# Patient Record
Sex: Female | Born: 1950 | Race: White | Hispanic: No | Marital: Married | Smoking: Former smoker
Health system: Southern US, Academic
[De-identification: ages and names within clinical notes are randomized; demographics above are authoritative.]

## PROBLEM LIST (undated history)

## (undated) DIAGNOSIS — I10 Essential (primary) hypertension: Secondary | ICD-10-CM

## (undated) DIAGNOSIS — E119 Type 2 diabetes mellitus without complications: Secondary | ICD-10-CM

## (undated) HISTORY — DX: Type 2 diabetes mellitus without complications (CMS HCC): E11.9

## (undated) HISTORY — DX: Essential (primary) hypertension: I10

---

## 2021-12-14 IMAGING — MR MRI BRAIN W/ IAC W & W/O CONTRAST
8 of 13 series · 25 of 48 positions shown · IV contrast (prohance)
Comparison: none

﻿

Pertinent Hx:  Balance issues.    15ml prohance
TECHNIQUE: T1, T2, and FLAIR weighted axial images of the brain were performed along with T2 weighted coronal images. Additional diffusion weighted images were performed.  Contrast is administered and T1 weighted axials, coronals, and sagittals are performed.  Thin section T2 volumetric axials are performed through the posterior fossa with attention to the internal auditory canals. Thin section coronals and axials with T1 are performed through the posterior fossa without and with the administration of 15 mL Prohance intravenous contrast.

[Series 2: T1 · sagittal · 5.0mm · 0.47mm/px · 3 of 24 slices shown (1 of 3)]
[im 1/24]
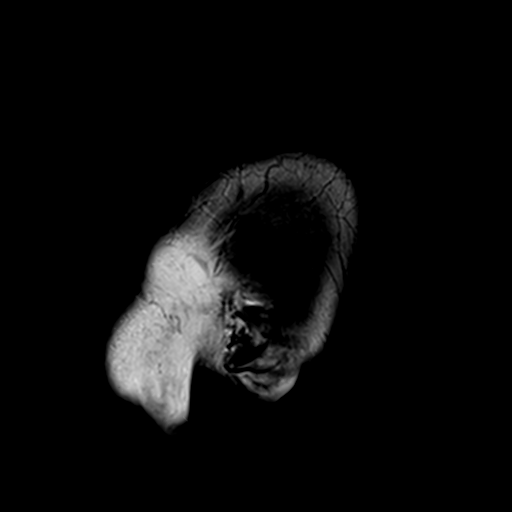
[im 12/24]
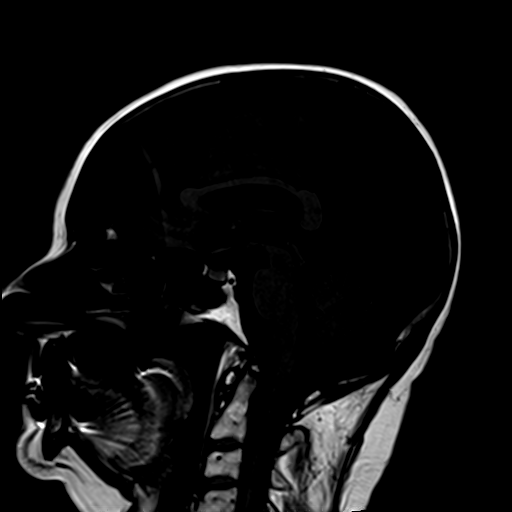
[im 24/24]
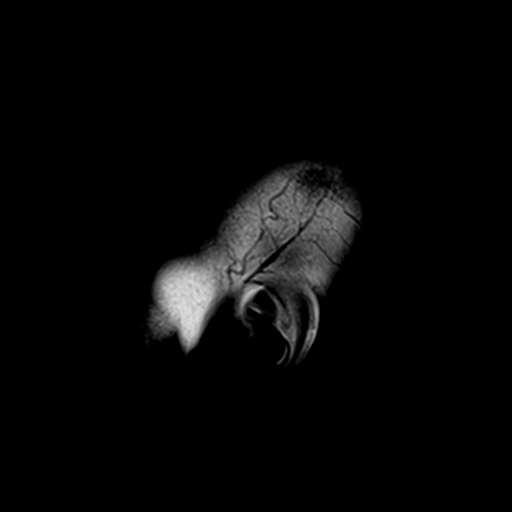

[Series 3: T2 · coronal · 5.0mm · 0.62mm/px · 4 of 30 slices shown (1 of 2)]
[im 1/30]
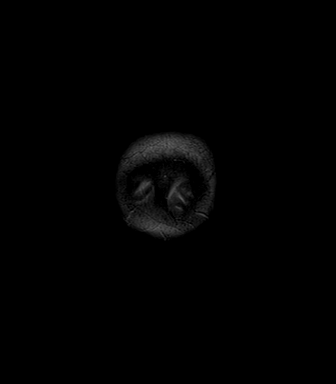
[im 10/30]
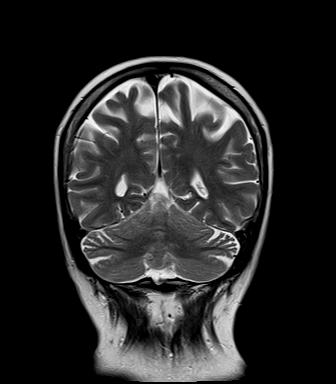
[im 20/30]
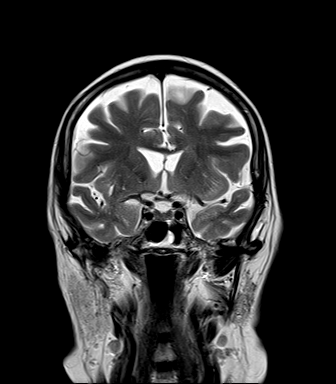
[im 30/30]
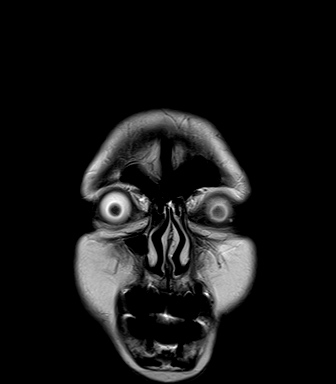

[Series 4: T2 · axial · 5.0mm · 0.62mm/px · z∈[-48,+88]mm · 4 of 26 slices shown (2 of 2)]
[im 1/26]
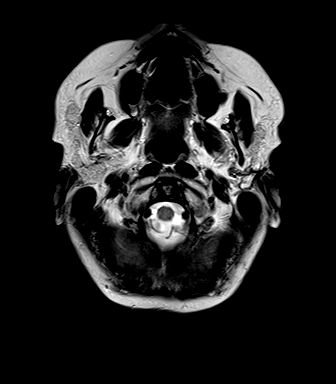
[im 9/26]
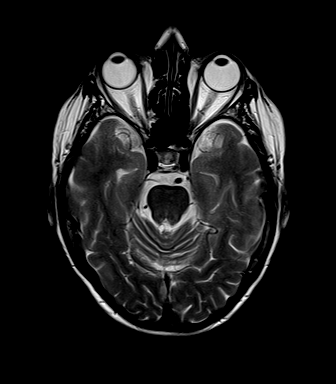
[im 17/26]
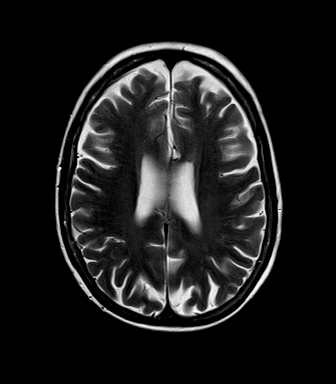
[im 26/26]
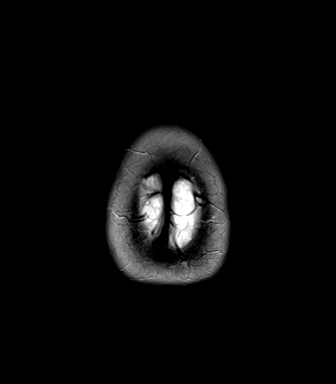

[Series 5: FLAIR · axial · 5.0mm · 0.47mm/px · z∈[-48,+88]mm · 4 of 26 slices shown]
[im 1/26]
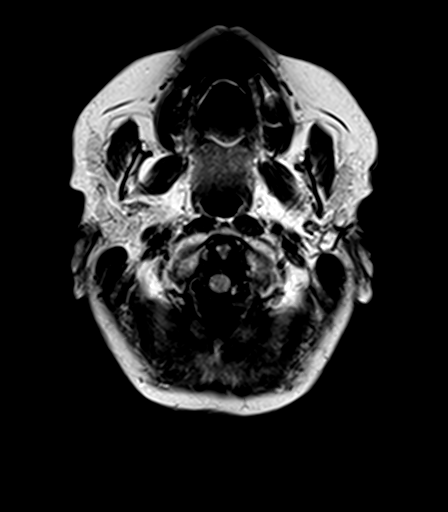
[im 9/26]
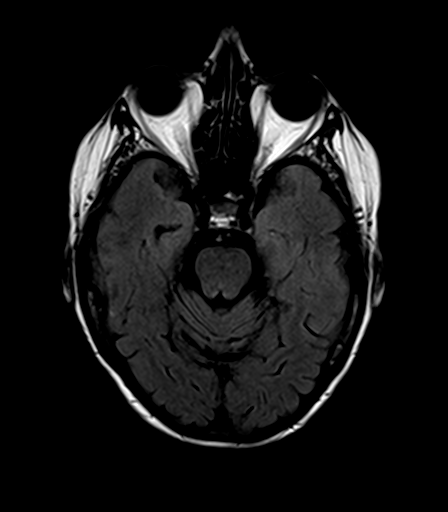
[im 17/26]
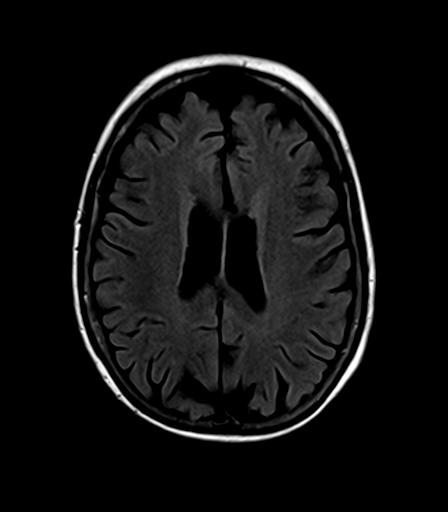
[im 26/26]
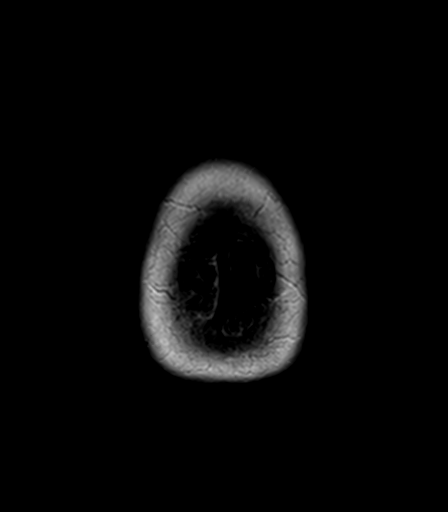

[Series 6: T1 · axial · 5.0mm · 0.47mm/px · z∈[-48,+88]mm · 4 of 26 slices shown (2 of 3)]
[im 1/26]
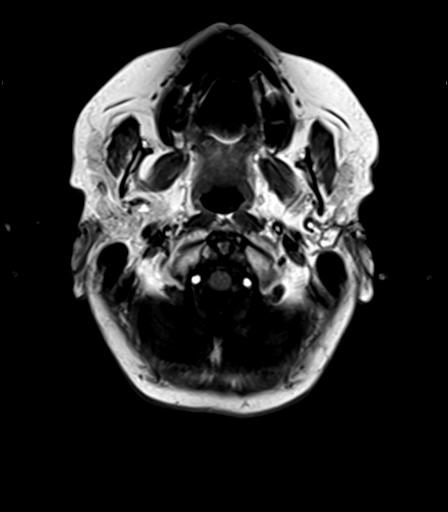
[im 9/26]
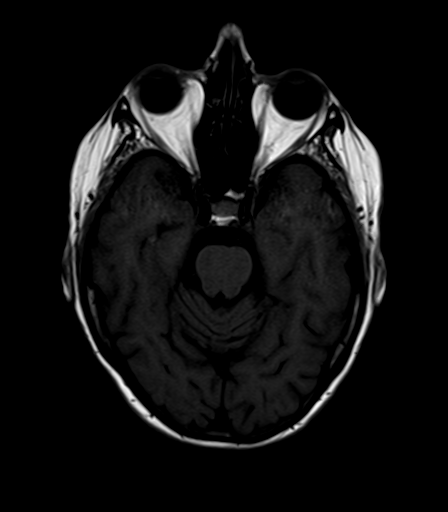
[im 17/26]
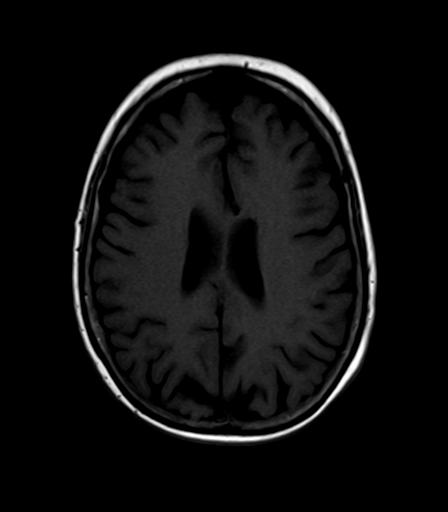
[im 26/26]
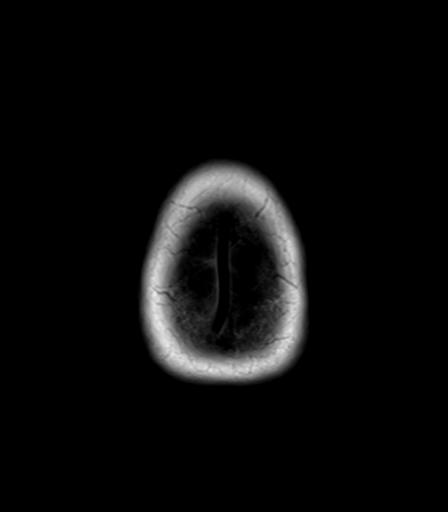

[Series 10: T1 · axial · 3.0mm · 0.35mm/px · z∈[-60,-14]mm · 2 of 15 slices shown (3 of 3)]
[im 1/15]
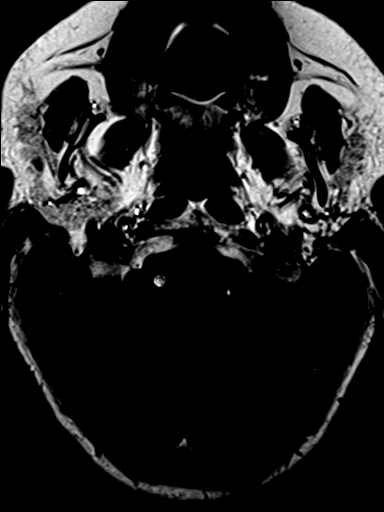
[im 15/15]
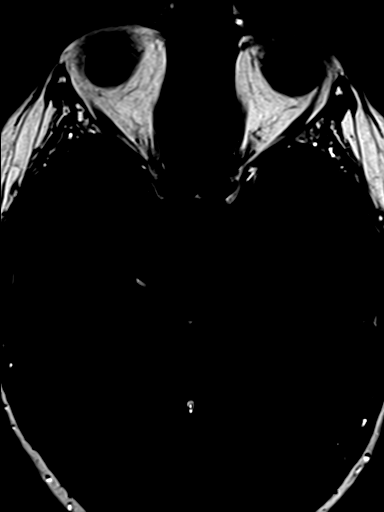

[Series 11: T1 post-contrast · axial · 3.0mm · 0.35mm/px · z∈[-60,-14]mm · 2 of 15 slices shown (1 of 2)]
[im 1/15]
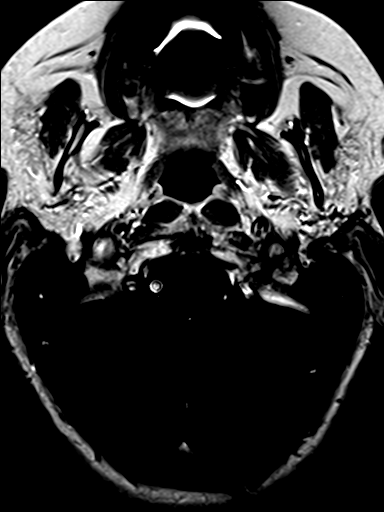
[im 15/15]
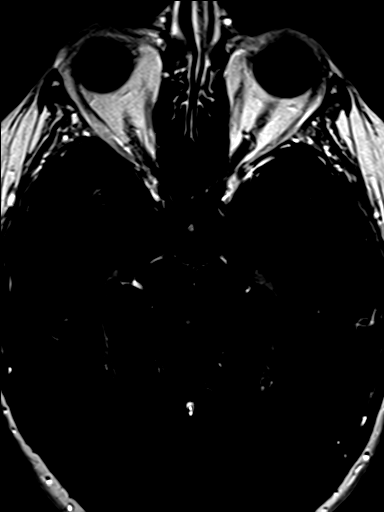

[Series 12: T1 post-contrast · coronal · 3.0mm · 0.37mm/px · 2 of 15 slices shown (2 of 2)]
[im 1/15]
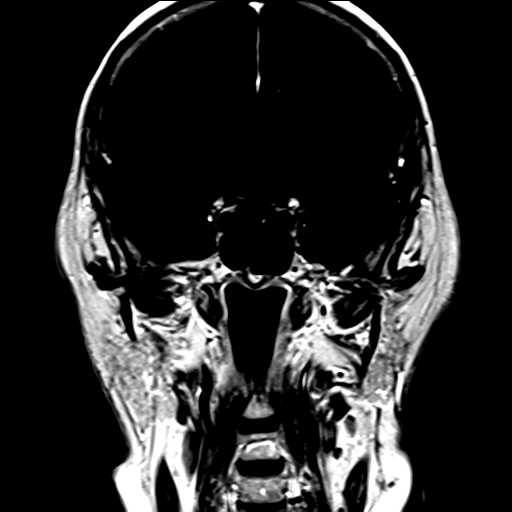
[im 15/15]
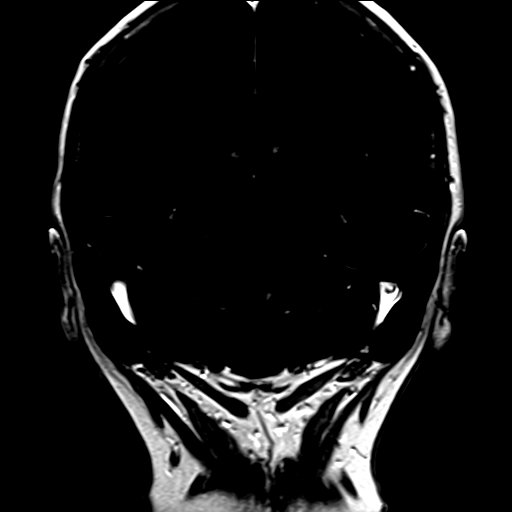

[25 of 48 positions shown; findings below may reference images not displayed]

FINDINGS: The fourth, third and lateral ventricles are normal in size and location.  There is a very minimal amount of frontoparietal atrophy.  There is no temporal lobe atrophy.  There is no ventriculomegaly.  There is some incidental fat in the posterior corpus callosum which is a normal variant.  Image 16 series 5.  No other pathologic abnormalities are noted.  Frontoparietal atrophy is minimal.  There is no temporal lobe atrophy or ventriculomegaly.  No abnormal enhancement is identified.
IMPRESSION: No abnormal enhancement is identified on this study.  There is very minimal frontoparietal atrophy on this study without ventriculomegaly.  No abnormal enhancement is seen.

## 2022-01-13 IMAGING — MR MRI CERVICAL SPINE WITHOUT CONTRAST
6 series · 40 of 48 positions shown · non-contrast
Comparison: prior scan dated May 21, 2020 is reviewed.

﻿

Pertinent Hx:  Neck pain, loss of balance.
TECHNIQUE: Sagittal images with T1, T2, and STIR weighting are performed through the cervical spine. Axial images with T1 and T2 weighting are performed through the C2-3, C3-4, C4-5, C5-6, C6-7, and C7-T1 disc spaces.

[Series 2: T2 · sagittal · 3.0mm · 0.39mm/px · 6 of 15 slices shown (1 of 3)]
[im 1/15]
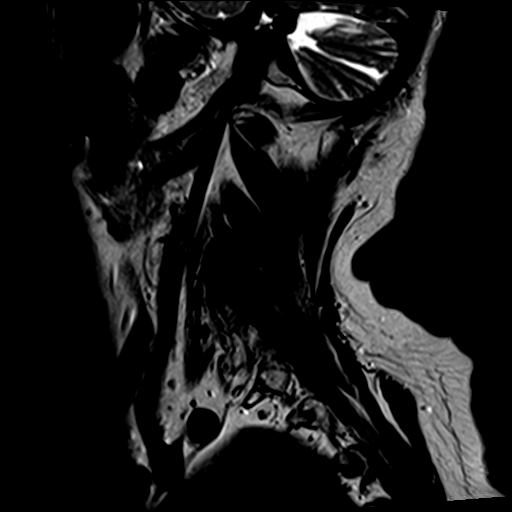
[im 3/15]
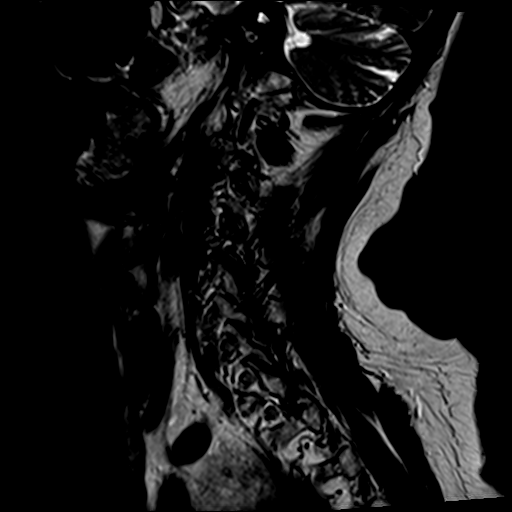
[im 6/15]
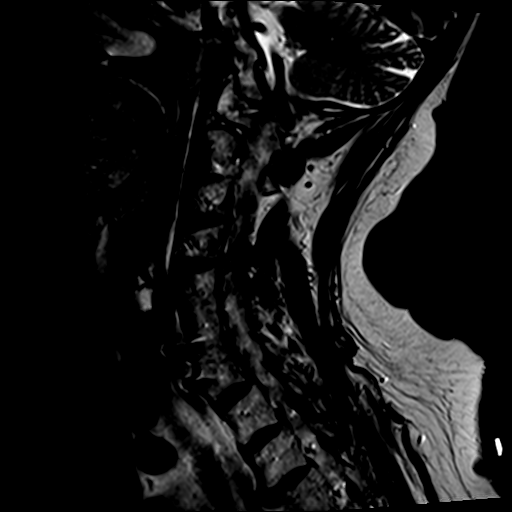
[im 9/15]
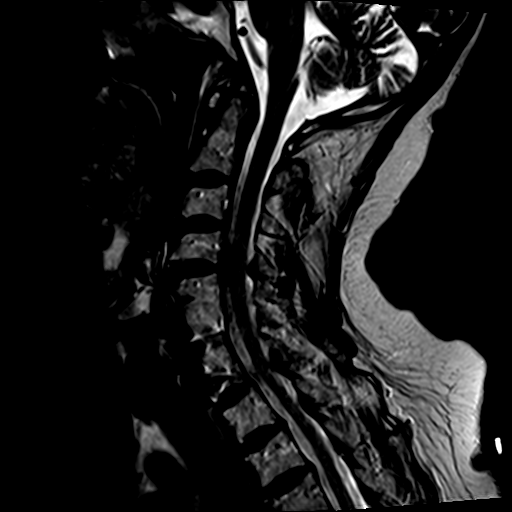
[im 12/15]
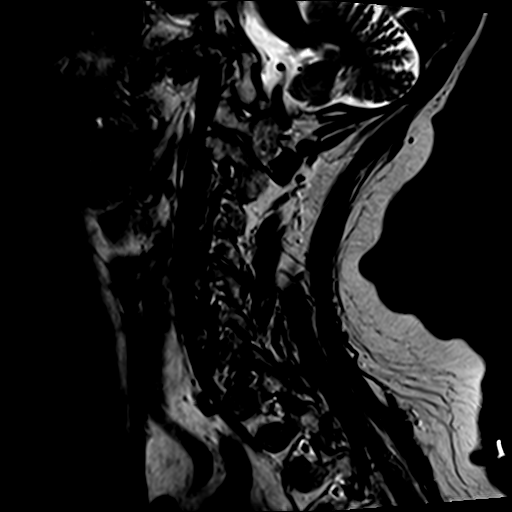
[im 15/15]
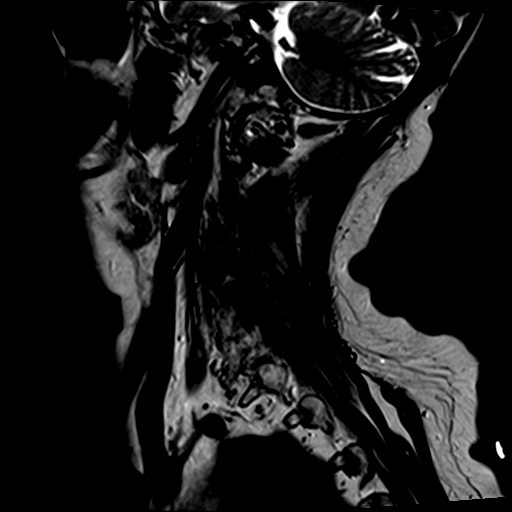

[Series 3: STIR · sagittal · 3.0mm · 0.39mm/px · 4 of 15 slices shown]
[im 1/15]
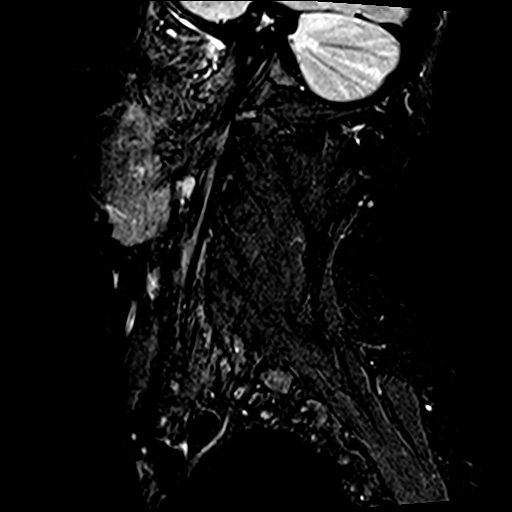
[im 3/15]
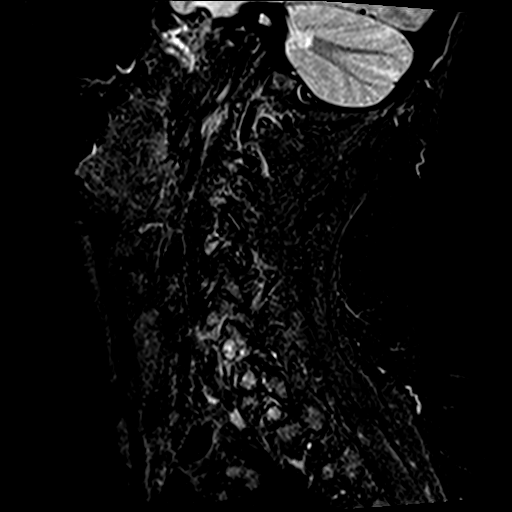
[im 6/15]
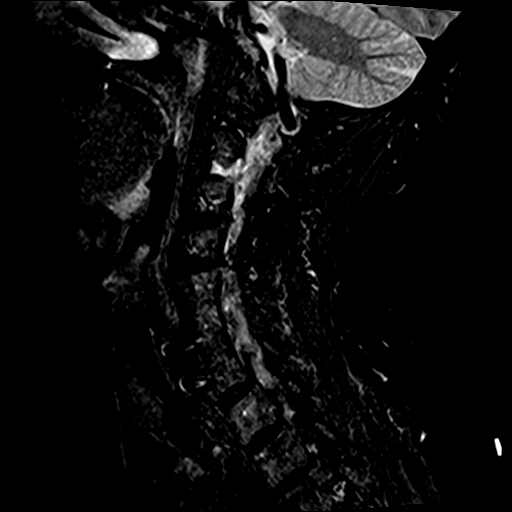
[im 9/15]
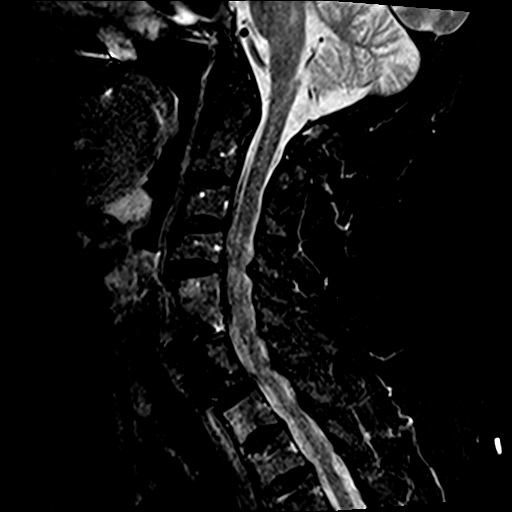

[Series 4: T2 · axial · 3.5mm · 0.62mm/px · z∈[-71,+21]mm · 8 of 23 slices shown (2 of 3)]
[im 1/23]
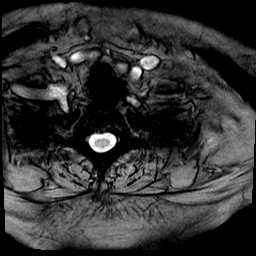
[im 3/23]
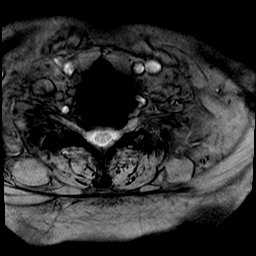
[im 8/23]
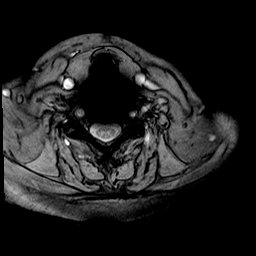
[im 10/23]
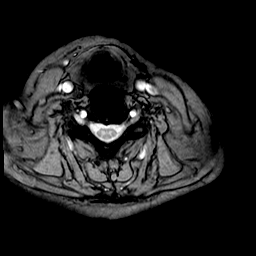
[im 13/23]
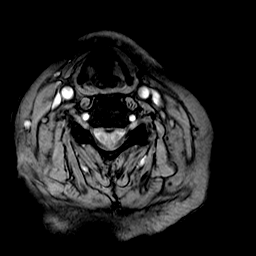
[im 15/23]
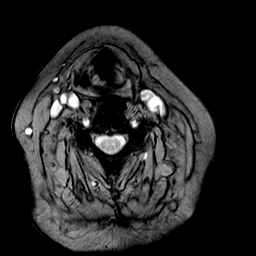
[im 20/23]
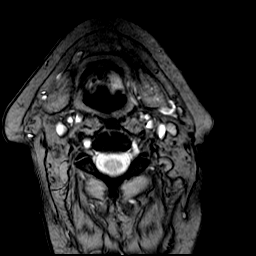
[im 23/23]
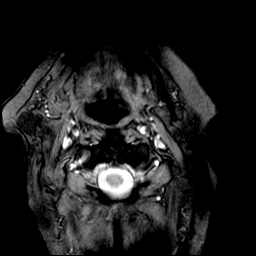

[Series 5: T2 · axial · 3.5mm · 0.62mm/px · z∈[-70,+22]mm · 8 of 23 slices shown (3 of 3)]
[im 1/23]
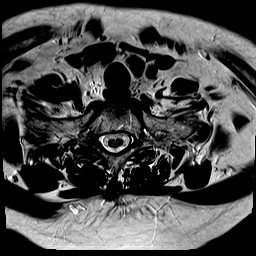
[im 3/23]
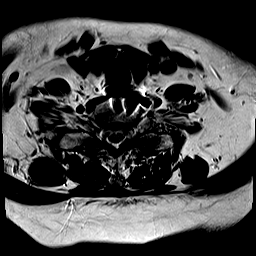
[im 8/23]
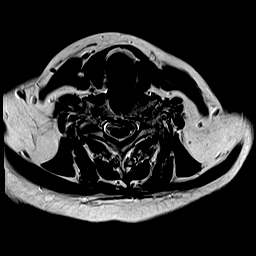
[im 10/23]
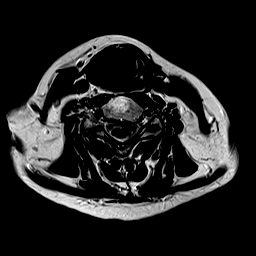
[im 13/23]
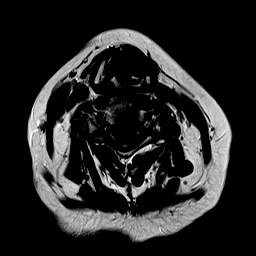
[im 15/23]
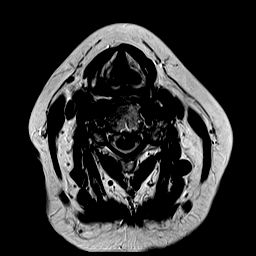
[im 20/23]
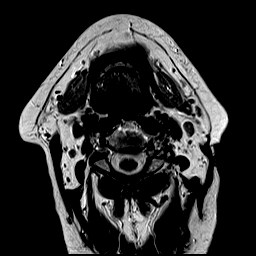
[im 23/23]
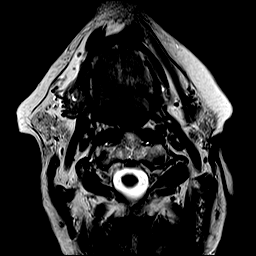

[Series 6: T1 · axial · 3.5mm · 0.62mm/px · z∈[-70,+22]mm · 8 of 23 slices shown (1 of 2)]
[im 1/23]
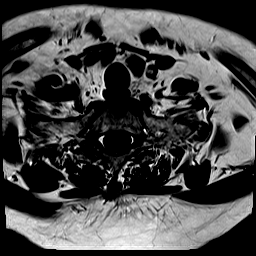
[im 3/23]
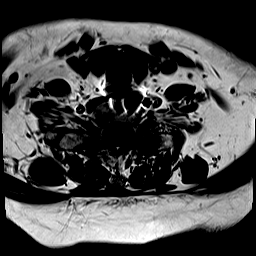
[im 8/23]
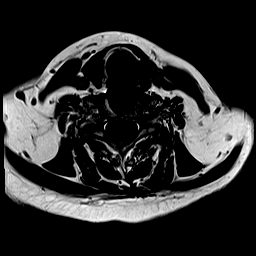
[im 10/23]
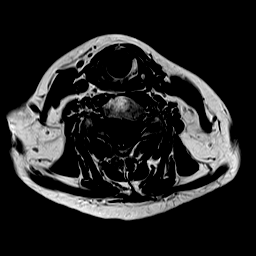
[im 13/23]
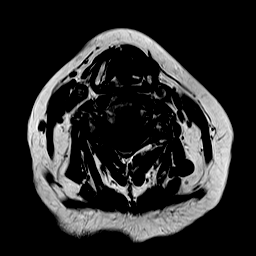
[im 15/23]
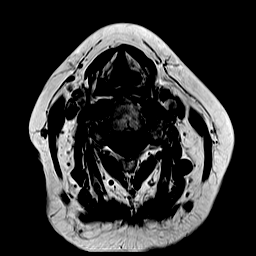
[im 20/23]
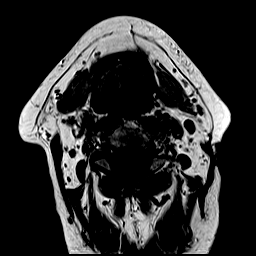
[im 23/23]
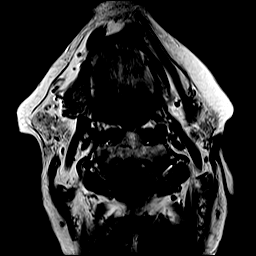

[Series 7: T1 · sagittal · 3.0mm · 0.78mm/px · 6 of 15 slices shown (2 of 2)]
[im 1/15]
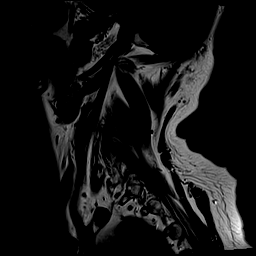
[im 3/15]
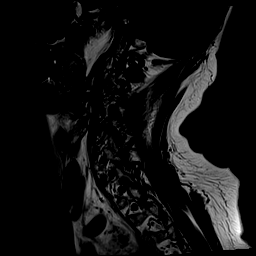
[im 6/15]
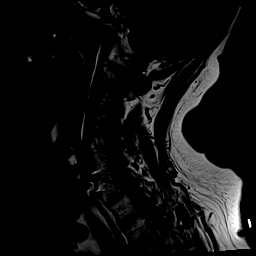
[im 9/15]
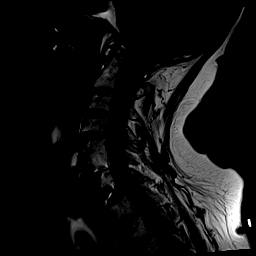
[im 12/15]
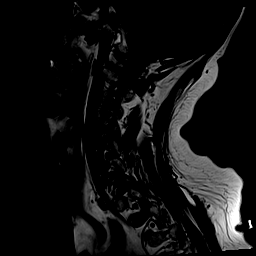
[im 15/15]
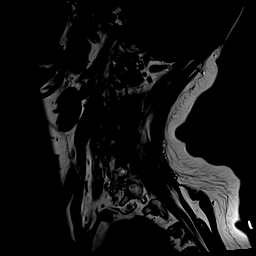

[40 of 48 positions shown; findings below may reference images not displayed]

FINDINGS: C5-C6 is completely fused which was found on the prior scan of April 2020.  

An anterior cervical discectomy with fusion and plating at C6-C7 is present unchanged when compared to the prior scan.  

A disc osteophyte complex arises posterior to the C4-C5 disc space and minimally effaces the subarachnoid space and there is no significant canal stenosis.  There is a very large degree of biforaminal stenosis at C4-C5.  There are no new changes at C4-C5 when compared to the prior scan.  

A minimal number of osteophytes arise from the posterior disc margin at C3-C4.  There is no significant neural canal stenosis at C3-C4.  There is a mild degree of biforaminal stenosis at C3-C4 bilaterally due to spurring arising from the uncovertebral joints.  These changes are unchanged when compared to the prior scan of 2020.  

C2-C3 remains normal.  

No canal or foraminal stenosis is present at C7-T1.
IMPRESSION: No interval change since the prior scan of May 21, 2020.  There has been no further surgery and there is no significant canal or foraminal stenosis at any level.  No abnormal signal is present within the cord.

## 2023-06-13 ENCOUNTER — Emergency Department (HOSPITAL_COMMUNITY): Payer: Medicare Other

## 2023-06-13 ENCOUNTER — Inpatient Hospital Stay (HOSPITAL_COMMUNITY): Payer: Medicare Other

## 2023-06-13 ENCOUNTER — Other Ambulatory Visit (HOSPITAL_COMMUNITY): Payer: Medicare Other

## 2023-06-13 ENCOUNTER — Other Ambulatory Visit: Payer: Self-pay

## 2023-06-13 ENCOUNTER — Inpatient Hospital Stay (HOSPITAL_COMMUNITY): Payer: Medicare Other | Admitting: Radiology

## 2023-06-13 ENCOUNTER — Encounter (HOSPITAL_COMMUNITY): Payer: Self-pay

## 2023-06-13 ENCOUNTER — Inpatient Hospital Stay
Admission: EM | Admit: 2023-06-13 | Discharge: 2023-06-16 | DRG: 551 | Disposition: A | Discharge status: Home / Self Care | Payer: Medicare Other | Source: Other Acute Inpatient Hospital | Attending: SURGICAL CRITICAL CARE | Admitting: SURGICAL CRITICAL CARE

## 2023-06-13 ENCOUNTER — Encounter (HOSPITAL_COMMUNITY): Payer: Self-pay | Admitting: SURGICAL CRITICAL CARE

## 2023-06-13 DIAGNOSIS — S52502A Unspecified fracture of the lower end of left radius, initial encounter for closed fracture: Secondary | ICD-10-CM | POA: Diagnosis present

## 2023-06-13 DIAGNOSIS — S066X0A Traumatic subarachnoid hemorrhage without loss of consciousness, initial encounter: Secondary | ICD-10-CM | POA: Diagnosis present

## 2023-06-13 DIAGNOSIS — S12291A Other nondisplaced fracture of third cervical vertebra, initial encounter for closed fracture: Secondary | ICD-10-CM

## 2023-06-13 DIAGNOSIS — E785 Hyperlipidemia, unspecified: Secondary | ICD-10-CM | POA: Diagnosis present

## 2023-06-13 DIAGNOSIS — S12290A Other displaced fracture of third cervical vertebra, initial encounter for closed fracture: Secondary | ICD-10-CM

## 2023-06-13 DIAGNOSIS — S12201A Unspecified nondisplaced fracture of third cervical vertebra, initial encounter for closed fracture: Principal | ICD-10-CM | POA: Diagnosis present

## 2023-06-13 DIAGNOSIS — R000 Tachycardia, unspecified: Secondary | ICD-10-CM

## 2023-06-13 DIAGNOSIS — F32A Depression, unspecified: Secondary | ICD-10-CM | POA: Diagnosis present

## 2023-06-13 DIAGNOSIS — S3981XA Other specified injuries of abdomen, initial encounter: Secondary | ICD-10-CM

## 2023-06-13 DIAGNOSIS — M2578 Osteophyte, vertebrae: Secondary | ICD-10-CM | POA: Diagnosis present

## 2023-06-13 DIAGNOSIS — Z880 Allergy status to penicillin: Secondary | ICD-10-CM

## 2023-06-13 DIAGNOSIS — W19XXXA Unspecified fall, initial encounter: Secondary | ICD-10-CM

## 2023-06-13 DIAGNOSIS — S12200A Unspecified displaced fracture of third cervical vertebra, initial encounter for closed fracture: Secondary | ICD-10-CM | POA: Insufficient documentation

## 2023-06-13 DIAGNOSIS — I479 Paroxysmal tachycardia, unspecified: Secondary | ICD-10-CM

## 2023-06-13 DIAGNOSIS — S066XAA Traumatic subarachnoid hemorrhage with loss of consciousness status unknown, initial encounter (CMS HCC): Secondary | ICD-10-CM

## 2023-06-13 DIAGNOSIS — F419 Anxiety disorder, unspecified: Secondary | ICD-10-CM | POA: Diagnosis present

## 2023-06-13 DIAGNOSIS — W010XXA Fall on same level from slipping, tripping and stumbling without subsequent striking against object, initial encounter: Secondary | ICD-10-CM | POA: Diagnosis present

## 2023-06-13 DIAGNOSIS — I340 Nonrheumatic mitral (valve) insufficiency: Secondary | ICD-10-CM

## 2023-06-13 DIAGNOSIS — I609 Nontraumatic subarachnoid hemorrhage, unspecified: Secondary | ICD-10-CM

## 2023-06-13 DIAGNOSIS — E119 Type 2 diabetes mellitus without complications: Secondary | ICD-10-CM | POA: Diagnosis present

## 2023-06-13 DIAGNOSIS — F17210 Nicotine dependence, cigarettes, uncomplicated: Secondary | ICD-10-CM | POA: Diagnosis present

## 2023-06-13 DIAGNOSIS — I499 Cardiac arrhythmia, unspecified: Secondary | ICD-10-CM

## 2023-06-13 DIAGNOSIS — Z79899 Other long term (current) drug therapy: Secondary | ICD-10-CM

## 2023-06-13 DIAGNOSIS — I10 Essential (primary) hypertension: Secondary | ICD-10-CM | POA: Diagnosis present

## 2023-06-13 DIAGNOSIS — I4719 Other supraventricular tachycardia: Secondary | ICD-10-CM | POA: Diagnosis present

## 2023-06-13 DIAGNOSIS — S3993XA Unspecified injury of pelvis, initial encounter: Secondary | ICD-10-CM

## 2023-06-13 DIAGNOSIS — Z7985 Long-term (current) use of injectable non-insulin antidiabetic drugs: Secondary | ICD-10-CM

## 2023-06-13 DIAGNOSIS — W1809XA Striking against other object with subsequent fall, initial encounter: Secondary | ICD-10-CM

## 2023-06-13 DIAGNOSIS — S52592A Other fractures of lower end of left radius, initial encounter for closed fracture: Secondary | ICD-10-CM

## 2023-06-13 DIAGNOSIS — D72829 Elevated white blood cell count, unspecified: Secondary | ICD-10-CM

## 2023-06-13 DIAGNOSIS — J9811 Atelectasis: Secondary | ICD-10-CM | POA: Diagnosis present

## 2023-06-13 DIAGNOSIS — W1830XA Fall on same level, unspecified, initial encounter: Secondary | ICD-10-CM

## 2023-06-13 DIAGNOSIS — J329 Chronic sinusitis, unspecified: Secondary | ICD-10-CM | POA: Diagnosis present

## 2023-06-13 DIAGNOSIS — S298XXA Other specified injuries of thorax, initial encounter: Secondary | ICD-10-CM

## 2023-06-13 DIAGNOSIS — Z881 Allergy status to other antibiotic agents status: Secondary | ICD-10-CM

## 2023-06-13 DIAGNOSIS — Z794 Long term (current) use of insulin: Secondary | ICD-10-CM

## 2023-06-13 DIAGNOSIS — S5290XA Unspecified fracture of unspecified forearm, initial encounter for closed fracture: Secondary | ICD-10-CM

## 2023-06-13 LAB — TROPONIN-I
TROPONIN-I HS: 3.4 ng/L (ref <=14.0)
TROPONIN-I HS: 2.9 ng/L (ref <=14.0)

## 2023-06-13 LAB — CBC WITH DIFF
BASOPHIL #: <0.1 10*3/uL (ref <=0.20)
BASOPHIL %: 0.6 %
EOSINOPHIL #: 0.38 10*3/uL (ref <=0.50)
EOSINOPHIL %: 3 %
HCT: 32.7 % — ABNORMAL LOW (ref 34.8–46.0)
HGB: 10.6 g/dL — ABNORMAL LOW (ref 11.5–16.0)
IMMATURE GRANULOCYTE #: <0.1 10*3/uL (ref <0.10)
IMMATURE GRANULOCYTE %: 0.3 % (ref 0.0–1.0)
LYMPHOCYTE #: 1.89 10*3/uL (ref 1.00–4.80)
LYMPHOCYTE %: 14.9 %
MCH: 29.1 pg (ref 26.0–32.0)
MCHC: 32.4 g/dL (ref 31.0–35.5)
MCV: 89.8 fL (ref 78.0–100.0)
MONOCYTE #: 1.44 10*3/uL — ABNORMAL HIGH (ref 0.20–1.10)
MONOCYTE %: 11.3 %
MPV: 9.3 fL (ref 8.7–12.5)
NEUTROPHIL #: 8.89 10*3/uL — ABNORMAL HIGH (ref 1.50–7.70)
NEUTROPHIL %: 69.9 %
PLATELETS: 222 10*3/uL (ref 150–400)
RBC: 3.64 10*6/uL — ABNORMAL LOW (ref 3.85–5.22)
RDW-CV: 13.7 % (ref 11.5–15.5)
WBC: 12.7 10*3/uL — ABNORMAL HIGH (ref 3.7–11.0)

## 2023-06-13 LAB — DRUG SCREEN, NO CONFIRMATION, URINE
AMPHETAMINES, URINE: NEGATIVE
BARBITURATES URINE: NEGATIVE
BENZODIAZEPINES URINE: NEGATIVE
BUPRENORPHINE URINE: NEGATIVE
CANNABINOIDS URINE: NEGATIVE
COCAINE METABOLITES URINE: NEGATIVE
CREATININE RANDOM URINE: 31 mg/dL (ref >=20)
ECSTASY/MDMA URINE: NEGATIVE
FENTANYL, RANDOM URINE: NEGATIVE
METHADONE URINE: NEGATIVE
OPIATES URINE (LOW CUTOFF): NEGATIVE
OXYCODONE URINE: POSITIVE — AB

## 2023-06-13 LAB — BLOOD GAS W/ CO-OX, LYTES, LACTATE REFLEX
%FIO2 (VENOUS): 21 %
BASE EXCESS: 1.5 mmol/L (ref 0.0–3.0)
BICARBONATE (VENOUS): 25.3 mmol/L (ref 22.0–29.0)
CARBOXYHEMOGLOBIN: 2 % (ref <=3.0)
CHLORIDE: 100 mmol/L (ref 98–107)
GLUCOSE: 136 mg/dL — ABNORMAL HIGH (ref 65–125)
HEMATOCRITRT: 33 % — ABNORMAL LOW (ref 37–50)
HEMOGLOBIN: 11 g/dL — ABNORMAL LOW (ref 12.0–18.0)
IONIZED CALCIUM: 1.21 mmol/L (ref 1.15–1.33)
LACTATE: 2.2 mmol/L — ABNORMAL HIGH (ref <=1.9)
MET-HEMOGLOBIN: <0.7 % (ref <=1.5)
O2 SATURATION (VENOUS): 74.9 %
O2CT: 9.3 %
OXYHEMOGLOBIN: 59.8 %
PCO2 (VENOUS): 43 mm/Hg (ref 41–51)
PH (VENOUS): 7.4 (ref 7.32–7.43)
PO2 (VENOUS): 40 mm/Hg
SODIUM: 133 mmol/L — ABNORMAL LOW (ref 136–145)
WHOLE BLOOD POTASSIUM: 3.8 mmol/L (ref 3.5–5.1)

## 2023-06-13 LAB — TRANSTHORACIC ECHOCARDIOGRAM - ADULT
Aortic Valve Area by Continuity of Peak Velocity: 2.21 cm2
Aortic Valve Area by Continuity of VTI: 2.41 cm2
FS: 35 percent (ref 28–44)
IVS: 1 cm (ref 0.6–1.1)
LV Diastolic Volume Index: 54.8 milliliter
LV Diastolic Volume Index: 69.3 milliliter
LV Diastolic Volume Index: 77.7 milliliter
LV Diastolic Volume Index: 84.3 milliliter
LV Systolic Volume Index: 11.9 milliliter
LV Systolic Volume Index: 14.6 milliliter
LV Systolic Volume Index: 27.4 milliliter
LV Systolic Volume Index: 9.3 milliliter
LVIDD: 4.18 cm (ref 3.5–6.0)
LVIDS: 2.72 cm (ref 2.1–4.0)
LVOT diameter: 2.08 cm
LVOT stroke volume: 78.7 milliliter
LVPWD: 1.07 cm
MV Deceleration Time: 213.05 ms
MV Peak A Vel: 92.52 cm/s
MV Peak E Vel: 78.57 cm/s
Pulmonic Valve Acceleration Time: 89.44 ms
TR VELOCITY: 242.97 cm/s
TR VELOCITY: 242.97 cm/s

## 2023-06-13 LAB — URINALYSIS, MICROSCOPIC
RBCS: 0 /hpf (ref <6.0)
WBCS: <1 /hpf (ref <11.0)

## 2023-06-13 LAB — LACTIC ACID - FIRST REFLEX: LACTIC ACID: 1.5 mmol/L (ref 0.5–2.2)

## 2023-06-13 LAB — ETHANOL, SERUM/PLASMA
ETHANOL: <10 mg/dL (ref <10)
ETHANOL: NOT DETECTED

## 2023-06-13 LAB — URINALYSIS, MACROSCOPIC
BILIRUBIN: NEGATIVE mg/dL
BLOOD: NEGATIVE mg/dL
COLOR: NORMAL
GLUCOSE: >=1000 mg/dL — AB
KETONES: 10 mg/dL — AB
LEUKOCYTES: NEGATIVE WBCs/uL
NITRITE: NEGATIVE
PH: 6.5 (ref 5.0–8.0)
PROTEIN: NEGATIVE mg/dL
SPECIFIC GRAVITY: 1.035 — ABNORMAL HIGH (ref 1.005–1.030)
UROBILINOGEN: NEGATIVE mg/dL

## 2023-06-13 LAB — BASIC METABOLIC PANEL
ANION GAP: 12 mmol/L (ref 4–13)
BUN/CREA RATIO: 20 (ref 6–22)
BUN: 16 mg/dL (ref 8–25)
CALCIUM: 9.8 mg/dL (ref 8.6–10.3)
CHLORIDE: 99 mmol/L (ref 96–111)
CO2 TOTAL: 24 mmol/L (ref 23–31)
CREATININE: 0.79 mg/dL (ref 0.60–1.05)
ESTIMATED GFR - FEMALE: 80 mL/min/BSA (ref >=60)
GLUCOSE: 131 mg/dL — ABNORMAL HIGH (ref 65–125)
POTASSIUM: 3.7 mmol/L (ref 3.5–5.1)
SODIUM: 135 mmol/L — ABNORMAL LOW (ref 136–145)

## 2023-06-13 LAB — PT/INR
INR: 1.03 (ref 0.80–1.20)
PROTHROMBIN TIME: 12.5 seconds (ref 9.8–14.4)

## 2023-06-13 LAB — TYPE AND SCREEN
ABO/RH(D): A POS
ANTIBODY SCREEN: NEGATIVE

## 2023-06-13 LAB — PTT (PARTIAL THROMBOPLASTIN TIME): APTT: 22.5 seconds — ABNORMAL LOW (ref 24.2–37.5)

## 2023-06-13 LAB — POC BLOOD GLUCOSE (RESULTS)
GLUCOSE, POC: 111 mg/dl — ABNORMAL HIGH (ref 70–105)
GLUCOSE, POC: 83 mg/dl (ref 70–105)

## 2023-06-13 MED ORDER — AMLODIPINE 5 MG TABLET
2.5000 mg | ORAL_TABLET | Freq: Every day | ORAL | Status: DC
Start: 2023-06-14 — End: 2023-06-16
  Administered 2023-06-14: 2.5 mg via ORAL
  Filled 2023-06-13: qty 1

## 2023-06-13 MED ORDER — TRIAMTERENE 75 MG-HYDROCHLOROTHIAZIDE 50 MG TABLET
1.0000 | ORAL_TABLET | Freq: Every day | ORAL | Status: DC
Start: 2023-06-14 — End: 2023-06-16
  Administered 2023-06-14 – 2023-06-16 (×3): 1 via ORAL
  Filled 2023-06-13 (×3): qty 1

## 2023-06-13 MED ORDER — AMITRIPTYLINE 10 MG TABLET
10.0000 mg | ORAL_TABLET | Freq: Every evening | ORAL | Status: DC
Start: 2023-06-13 — End: 2023-06-16
  Administered 2023-06-13 – 2023-06-15 (×3): 10 mg via ORAL
  Filled 2023-06-13 (×4): qty 1

## 2023-06-13 MED ORDER — SENNOSIDES 8.6 MG-DOCUSATE SODIUM 50 MG TABLET
1.0000 | ORAL_TABLET | Freq: Two times a day (BID) | ORAL | Status: DC
Start: 2023-06-13 — End: 2023-06-16
  Administered 2023-06-13: 0 via ORAL
  Administered 2023-06-13 – 2023-06-16 (×6): 1 via ORAL
  Filled 2023-06-13 (×6): qty 1

## 2023-06-13 MED ORDER — SODIUM CHLORIDE 0.9 % (FLUSH) INJECTION SYRINGE
2.0000 mL | INJECTION | INTRAMUSCULAR | Status: DC | PRN
Start: 2023-06-13 — End: 2023-06-16

## 2023-06-13 MED ORDER — ELECTROLYTE-A INTRAVENOUS SOLUTION
INTRAVENOUS | Status: DC
Start: 2023-06-13 — End: 2023-06-13
  Administered 2023-06-13: 0 mL via INTRAVENOUS

## 2023-06-13 MED ORDER — PANTOPRAZOLE 40 MG TABLET,DELAYED RELEASE
40.0000 mg | DELAYED_RELEASE_TABLET | Freq: Every evening | ORAL | Status: DC
Start: 2023-06-13 — End: 2023-06-16
  Administered 2023-06-13 – 2023-06-15 (×3): 40 mg via ORAL
  Filled 2023-06-13 (×3): qty 1

## 2023-06-13 MED ORDER — PAROXETINE 20 MG TABLET
30.0000 mg | ORAL_TABLET | Freq: Every morning | ORAL | Status: DC
Start: 2023-06-13 — End: 2023-06-13
  Administered 2023-06-13: 0 mg via ORAL
  Filled 2023-06-13 (×2): qty 1

## 2023-06-13 MED ORDER — SODIUM CHLORIDE 0.9 % (FLUSH) INJECTION SYRINGE
2.0000 mL | INJECTION | Freq: Three times a day (TID) | INTRAMUSCULAR | Status: DC
Start: 2023-06-13 — End: 2023-06-16
  Administered 2023-06-13 – 2023-06-14 (×4): 2 mL
  Administered 2023-06-14: 0 mL
  Administered 2023-06-15: 2 mL
  Administered 2023-06-15: 5 mL
  Administered 2023-06-15 – 2023-06-16 (×3): 0 mL

## 2023-06-13 MED ORDER — HYDRALAZINE 20 MG/ML INJECTION SOLUTION
10.0000 mg | INTRAMUSCULAR | Status: DC | PRN
Start: 2023-06-13 — End: 2023-06-15

## 2023-06-13 MED ORDER — GABAPENTIN 100 MG CAPSULE
100.0000 mg | ORAL_CAPSULE | Freq: Two times a day (BID) | ORAL | Status: DC
Start: 2023-06-13 — End: 2023-06-14
  Administered 2023-06-13 – 2023-06-14 (×2): 100 mg via ORAL
  Filled 2023-06-13 (×2): qty 1

## 2023-06-13 MED ORDER — ALBUTEROL SULFATE 2.5 MG/3 ML (0.083 %) SOLUTION FOR NEBULIZATION
2.5000 mg | INHALATION_SOLUTION | RESPIRATORY_TRACT | Status: DC | PRN
Start: 2023-06-13 — End: 2023-06-16
  Administered 2023-06-13: 2.5 mg via RESPIRATORY_TRACT
  Filled 2023-06-13: qty 3

## 2023-06-13 MED ORDER — EMPAGLIFLOZIN 12.5 MG-METFORMIN 1,000 MG TABLET
1.0000 | ORAL_TABLET | Freq: Every day | ORAL | Status: DC
Start: 2023-06-13 — End: 2023-06-13

## 2023-06-13 MED ORDER — OXYCODONE 10 MG TABLET
10.0000 mg | ORAL_TABLET | ORAL | Status: DC | PRN
Start: 2023-06-13 — End: 2023-06-16
  Administered 2023-06-13 – 2023-06-15 (×4): 10 mg via ORAL
  Filled 2023-06-13 (×4): qty 1

## 2023-06-13 MED ORDER — SODIUM CHLORIDE 0.9 % INJECTION SOLUTION
2.0000 mL | INTRAVENOUS | Status: AC
Start: 2023-06-13 — End: 2023-06-13
  Administered 2023-06-13: 2 mL via INTRAVENOUS

## 2023-06-13 MED ORDER — PAROXETINE 20 MG TABLET
30.0000 mg | ORAL_TABLET | Freq: Every evening | ORAL | Status: DC
Start: 2023-06-13 — End: 2023-06-16
  Administered 2023-06-13 – 2023-06-15 (×3): 30 mg via ORAL
  Filled 2023-06-13 (×4): qty 1

## 2023-06-13 MED ORDER — DEXTROSE 5% IN WATER (D5W) FLUSH BAG - 250 ML
INTRAVENOUS | Status: DC | PRN
Start: 2023-06-13 — End: 2023-06-16

## 2023-06-13 MED ORDER — OXYCODONE 5 MG TABLET
5.0000 mg | ORAL_TABLET | ORAL | Status: DC | PRN
Start: 2023-06-13 — End: 2023-06-16
  Administered 2023-06-13 – 2023-06-16 (×5): 5 mg via ORAL
  Filled 2023-06-13 (×5): qty 1

## 2023-06-13 MED ORDER — LOSARTAN 50 MG TABLET
100.0000 mg | ORAL_TABLET | Freq: Every day | ORAL | Status: DC
Start: 2023-06-13 — End: 2023-06-14

## 2023-06-13 MED ORDER — ELECTROLYTE-A INTRAVENOUS SOLUTION
INTRAVENOUS | Status: DC
Start: 2023-06-13 — End: 2023-06-14
  Administered 2023-06-14: 0 mL via INTRAVENOUS

## 2023-06-13 MED ORDER — CHOLECALCIFEROL (VITAMIN D3) 25 MCG (1,000 UNIT) TABLET
1000.0000 [IU] | ORAL_TABLET | Freq: Every day | ORAL | Status: DC
Start: 2023-06-14 — End: 2023-06-16
  Administered 2023-06-14 – 2023-06-16 (×3): 1000 [IU] via ORAL
  Filled 2023-06-13 (×3): qty 1

## 2023-06-13 MED ORDER — CYANOCOBALAMIN (VIT B-12) 1,000 MCG TABLET
1000.0000 ug | ORAL_TABLET | Freq: Every day | ORAL | Status: DC
Start: 2023-06-13 — End: 2023-06-16
  Administered 2023-06-13: 0 ug via ORAL
  Administered 2023-06-14 – 2023-06-16 (×3): 1000 ug via ORAL
  Filled 2023-06-13 (×3): qty 1

## 2023-06-13 MED ORDER — MAGNESIUM L-LACTATE ER 84 MG TABLET,EXTENDED RELEASE
84.0000 mg | ORAL_TABLET | Freq: Every day | ORAL | Status: DC
Start: 2023-06-13 — End: 2023-06-13

## 2023-06-13 MED ORDER — SODIUM CHLORIDE 3 % FOR NEBULIZATION
3.0000 mL | INHALATION_SOLUTION | Freq: Once | RESPIRATORY_TRACT | Status: AC
Start: 2023-06-14 — End: 2023-06-13
  Administered 2023-06-13: 3 mL via RESPIRATORY_TRACT
  Filled 2023-06-13: qty 4

## 2023-06-13 MED ORDER — LEVETIRACETAM 500 MG TABLET
500.0000 mg | ORAL_TABLET | Freq: Two times a day (BID) | ORAL | Status: DC
Start: 2023-06-13 — End: 2023-06-16
  Administered 2023-06-13 – 2023-06-16 (×7): 500 mg via ORAL
  Filled 2023-06-13 (×7): qty 1

## 2023-06-13 MED ORDER — SODIUM CHLORIDE 0.9% FLUSH BAG - 250 ML
INTRAVENOUS | Status: DC | PRN
Start: 2023-06-13 — End: 2023-06-16

## 2023-06-13 MED ORDER — IOPAMIDOL 370 MG IODINE/ML (76 %) INTRAVENOUS SOLUTION
50.0000 mL | INTRAVENOUS | Status: AC
Start: 2023-06-13 — End: 2023-06-13
  Administered 2023-06-13: 50 mL via INTRAVENOUS

## 2023-06-13 MED ORDER — ACETAMINOPHEN 325 MG TABLET
650.0000 mg | ORAL_TABLET | Freq: Four times a day (QID) | ORAL | Status: DC
Start: 2023-06-13 — End: 2023-06-16
  Administered 2023-06-13 – 2023-06-16 (×13): 650 mg via ORAL
  Filled 2023-06-13 (×12): qty 2

## 2023-06-13 MED ORDER — INSULIN REGULAR HUMAN 100 UNIT/ML INJECTION SSIP
0.0000 [IU] | INJECTION | Freq: Four times a day (QID) | SUBCUTANEOUS | Status: DC | PRN
Start: 2023-06-13 — End: 2023-06-16

## 2023-06-13 MED ORDER — ROSUVASTATIN 20 MG TABLET
10.0000 mg | ORAL_TABLET | Freq: Every evening | ORAL | Status: DC
Start: 2023-06-13 — End: 2023-06-16
  Administered 2023-06-13 – 2023-06-15 (×3): 10 mg via ORAL
  Filled 2023-06-13 (×3): qty 1

## 2023-06-13 NOTE — ED Provider Notes (Signed)
[facility] - Emergency Department  ED Primary Provider Note  History of Present Illness   Chief Complaint   Patient presents with    Priority 2 Trauma     Haley Mccall is a 72 y.o. female who had concerns including Priority 2 Trauma.  Arrival: The patient arrived by Ambulance    HPI  The pt is a P2 transfer for fall. Per EMS, the pt was staying at a motel and woke up to use the restroom at 0400. While the pt was walking to the restroom she tripped over her luggage and fell into the [place]. After the incident the pt was able to ambulate and she denied any numbness or tingling. She called EMS, and was taken to [facility] where she was found to have a subarachnoid hemorrhage, C3 fx, and a distal radius fx. She was transferred to [facility] ED for a higher level of care. Upon arrival the pt endorses nausea. She has a hx of diabetes and uses insulin. Last known meal was last night. Allergies to bactrim, penicillins, codeine, and compazine. Pt denies anticoagulants.     History Reviewed This Encounter: Medical, Surgical, Family    Physical Exam   ED Triage Vitals   BP (Non-Invasive) 06/13/23 0957 (!) 136/58   Heart Rate 06/13/23 0957 90   Respiratory Rate 06/13/23 0957 18   Temperature 06/13/23 0957 (!) 35.9 C (96.7 F)   SpO2 06/13/23 0954 97 %   Weight 06/13/23 0957 67.7 kg (149 lb 4 oz)   Height 06/13/23 0957 1.651 m (5\' 5" )     Physical Exam  Vitals and nursing note reviewed.   Constitutional:       General: She is not in acute distress.     Appearance: She is well-developed.   HENT:      Head: Normocephalic and atraumatic.      Comments: Right forehead hematoma and right periorbital ecchymosis.  Eyes:      Conjunctiva/sclera: Conjunctivae normal.   Cardiovascular:      Rate and Rhythm: Normal rate and regular rhythm.      Heart sounds: No murmur heard.  Pulmonary:      Effort: Pulmonary effort is normal. No respiratory distress.      Breath sounds: Normal breath sounds.   Abdominal:       Palpations: Abdomen is soft.      Tenderness: There is no abdominal tenderness.   Musculoskeletal:         General: No swelling.      Cervical back: Neck supple.   Skin:     General: Skin is warm and dry.      Capillary Refill: Capillary refill takes less than 2 seconds.   Neurological:      Mental Status: She is alert.   Psychiatric:         Mood and Affect: Mood normal.         Patient Data     Labs Ordered/Reviewed   PTT (PARTIAL THROMBOPLASTIN TIME) - Abnormal; Notable for the following components:       Result Value    APTT 22.5 (*)     All other components within normal limits    Narrative:     Therapeutic range for unfractionated heparin is 60-100 seconds.   BLOOD GAS W/ CO-OX, LYTES, LACTATE REFLEX - Abnormal; Notable for the following components:    HEMOGLOBIN 11.0 (*)     HEMATOCRITRT 33 (*)     SODIUM 133 (*)  GLUCOSE 136 (*)     LACTATE 2.2 (*)     All other components within normal limits   BASIC METABOLIC PANEL - Abnormal; Notable for the following components:    SODIUM 135 (*)     GLUCOSE 131 (*)     All other components within normal limits   CBC WITH DIFF - Abnormal; Notable for the following components:    WBC 12.7 (*)     RBC 3.64 (*)     HGB 10.6 (*)     HCT 32.7 (*)     NEUTROPHIL # 8.89 (*)     MONOCYTE # 1.44 (*)     All other components within normal limits   PT/INR - Normal    Narrative:     Coumadin therapy INR range for Conventional Anticoagulation is 2.0 to 3.0 and for Intensive Anticoagulation 2.5 to 3.5.   TROPONIN-I - Normal   CBC/DIFF    Narrative:     The following orders were created for panel order CBC/DIFF.  Procedure                               Abnormality         Status                     ---------                               -----------         ------                     CBC WITH DIFF[[id number]]                Abnormal            Final result                 Please view results for these tests on the individual orders.   ETHANOL, SERUM/PLASMA     XR WRIST LEFT 2 VIEW    Final Result by Edi, Radresults In (06/17 1102)   Left distal radial nondisplaced fracture with no definitive intra-articular extension.            XR HAND LEFT 2 VIEW   Final Result by Edi, Radresults In (06/17 1104)   Nondisplaced fracture of the left distal radius            XR FOREARM LEFT   Final Result by Edi, Radresults In (06/17 1100)   Left distal radial fracture better seen on same day radiographs of the hand and wrist. Diffuse mineralization of bone.      CT CHEST ABDOMEN PELVIS WO IV CONTRAST   Final Result by Edi, Radresults In (06/17 1037)   No acute traumatic injury identified.      XR PELVIS   Final Result by Edi, Radresults In (06/17 1018)   There is normal bony architecture and alignment. Soft tissues appear unremarkable. No acute fracture or dislocation seen.        Medical Decision Making        Medical Decision Making  Amount and/or Complexity of Data Reviewed  Labs: ordered. Decision-making details documented in ED Course.  Radiology: ordered and independent interpretation performed. Decision-making details documented in ED Course.    Risk  Prescription drug management.  Parenteral controlled substances.  Decision regarding hospitalization.            Patient presents with history as above. Evaluated with the trauma team at bedside following ATLS protocols.   Primary survey unremarkable.  Secondary survey shows right forehead hematoma.  Forearm splinted.  Outside records reviewed.  She has a known trace subarachnoid, and C3 fracture.  Labs were repeated and notable to WBC 12.7, hemoglobin 10.6, creatinine 0.79.  No indication for any urgent interventions.  Additional x-rays obtained showing left distal radius fracture.  CTA extracranial showed patent vessels and CT chest abdomen pelvis without IV contrast showed no injury.  Ortho spine was consulted.  Patient admitted to Trauma for further management of her fractures.    Medications Administered in the ED   NS flush syringe (has no  administration in time range)   NS flush syringe (has no administration in time range)   NS 250 mL flush bag (has no administration in time range)   D5W 250 mL flush bag (has no administration in time range)   iopamidol (ISOVUE-370) 76% infusion (50 mL Intravenous Given 06/13/23 1015)   perflutren lipid microspheres (DEFINITY) 1.3 mL in NS 10 mL (tot vol) injection (has no administration in time range)     Clinical Impression   Fall (Primary)   C3 cervical fracture (CMS HCC)   Radius fracture       Disposition: Admitted         I am scribing for, and in the presence of, Dr. Michel Santee, for services provided on 06/13/2023  [facility] DeFazio SCRIBE    // Darcel Bayley DeFazio SCRIBE  06/13/2023, 10:17      I personally performed the services described in this documentation, as scribed  in my presence, and it is both accurate  and complete.    Michel Santee, MD/  PGY-3 Emergency Medicine  [facility]  06/14/23 10:35

## 2023-06-13 NOTE — Ancillary Notes (Signed)
SBIRT    SBIRT completed, which includes mental and substance use/abuse questions, as per [facility] SBIRT model.  Pending recommendations:  None.    Rosebud Poles, [facility] Clinical Therapist 06/13/2023, 13:54    Pager  [phone]    ASSIST Score per [facility] SBIRT model: 0

## 2023-06-13 NOTE — H&P (Signed)
[facility]  Trauma History & Physical      Haley Mccall, [place], 72 y.o. female  Date of Birth:  01/22/1951  Encounter Start Date:  06/13/2023  Inpatient Admission Date:  06/13/2023    Attending Physician: Cherly Hensen, MD    HPI:     Trauma Level Alert: P2  Arriving from: Outside [facility]    Pre-Hospital Report:  Time of Injury: 0200 AM  Patient Condition: Stable  Glasgow Coma Scale: Eye opening: 4 spontaneous, Verbal resonse:  5 oriented, Best motor response:  6 obeys commands  Intubated: No   Fluids Received in Route: No fluids recieved  Loss of Consciousness: No    Mechanism of Injury:   Fall  Height  standing, Landing on  side  right    Arrival to [facility]:  Information Obtained from: patient, health care provider, and history reviewed via medical record  Chief Complaint: Pain  head and neck, stabbing and aching, only with movement, Worse with positioning, and Better with medications  Additional HPI Details:  72 year old female with PMH HTN, DM managed with diet, who presents from outside facility s/p fall from standing, states she tripped falling forward, denies any LOC, striking her head and left side, reported immediate headache and nausea, vomiting, patient able to ambulate post fall and family notified EMS. Patient present to outside facility underwent CT Brain, CT C-spine, CT face, and x-rays, injuries include right distal radius fracture, trace SAH, and C3 fracture. Patient arrived VSS denies any headache, reports neck pain managed with collar, denies any numbness/tingling, loss of sensation, denies any chest pain, rib pain, nausea, vomiting, abdominal pain, back pain, weakness.     ROS:  MUST comment on all "Abnormal" findings   ROS Other than ROS in the HPI, all other systems were negative.    PAST MEDICAL/ FAMILY/ SOCIAL HISTORY:       Past Medical History:   Diagnosis Date    DM (diabetes mellitus) (CMS HCC)     HTN (hypertension)        Tetanus:  Given in ED  Medications Prior to Admission       Prescriptions    aMILoride-hydroCHLOROthiazide (MODURETIC) 5-50 mg Oral Tablet    Take 1 Tablet by mouth Once a day    amitriptyline (ELAVIL) 10 mg Oral Tablet    Take 1 Tablet (10 mg total) by mouth Every night    amLODIPine (NORVASC) 2.5 mg Oral Tablet    Take 1 Tablet (2.5 mg total) by mouth Once a day    cholecalciferol, vitamin D3, 50 mcg (2,000 unit) Oral Tablet    Take 1 Tablet (2,000 Units total) by mouth Once a day    cyanocobalamin (VITAMIN B 12) 1,000 mcg Oral Tablet    Take 1 Tablet (1,000 mcg total) by mouth Once a day    dulaglutide (TRULICITY) 1.5 mg/0.5 mL Subcutaneous Pen Injector    Inject 0.5 mL (1.5 mg total) under the skin Every 7 days    empagliflozin/metformin HCl (EMPAGLIFLOZIN-METFORMIN ORAL)    Take 12.5 mg by mouth Once a day    gabapentin (NEURONTIN) 100 mg Oral Capsule    Take 1 Capsule (100 mg total) by mouth Twice daily    losartan (COZAAR) 100 mg Oral Tablet    Take 1 Tablet (100 mg total) by mouth Once a day    magnesium chloride (SLOW-MAG) 64 mg Oral Tablet, Delayed Release (E.C.)    Take 1 Tablet (64  mg total) by mouth Once a day    omeprazole (PRILOSEC) 20 mg Oral Capsule, Delayed Release(E.C.)    Take 1 Capsule (20 mg total) by mouth Once a day    PARoxetine (PAXIL) 30 mg Oral Tablet    Take 1 Tablet (30 mg total) by mouth Every morning    rosuvastatin (CRESTOR) 10 mg Oral Tablet    Take 1 Tablet (10 mg total) by mouth Every evening           Allergies   Allergen Reactions    Bactrim [Sulfamethoxazole-Trimethoprim]     Compazine [Prochlorperazine]     Penicillins      Past Surgical History was reviewed and is negative for orthopedic surgery, cardiac surgery, stent    Code Status:  Alcoholism/Chronic alcohol use: no  Current Smoker: no  Drug Use Disorder: no  Functional Health Status: Independent - No assistance required for activities of daily living (May Use prosthetics or DME)  COPD: Patient does not have COPD  Cirrhosis no  CHF  no  Angina within the last 30 days no  HX of Myocardial Infarction (MI) no  Hypertension requiring medications yes  Chronic Renal Failure (prior/active Hemodialysis or Peritoneal Dialysis) no  Coma (less than 8): Coma -Not applicable  Dementia no  ADD/ADHD no  Major Psychiatric Illness no  Congenital abnormality no  Disseminated Cancer (metastatic disease) no  Steroid Use in the last 30 days (oral or inhaled) no  Diabetes (oral meds and/or insulin use) yes  Premature Birth (born before [redacted] weeks gestation) no    Family History: Denies any family history or problems with blood clots       PHYSICAL EXAMINATION: MUST comment on all "Abnormal" findings    INITIAL VITALS: Temperature: (!) 35.9 C (96.7 F)  BP (Non-Invasive): (!) 136/58  MAP (Non-Invasive): 81 mmHG  Heart Rate: 90  Respiratory Rate: 18  SpO2: 97 %  EXAM: Temperature: 37.1 C (98.8 F)  Heart Rate: 82  BP (Non-Invasive): 112/60  Respiratory Rate: 13  SpO2: 94 %  GEN:   NAD  HEENT:   Normocephalic;      , atraumatic pupils equal, round and reactive to light; ,  extraocular movements are intact., Conjunctivae pink, nasal mucosa normal, mucous membranes moist., No malocclusion. , and Abnormal:   ecchymosis  on right and swelling  on right  NECK:   Abnormal:   Collar not removed for examination due to fracture  PULM:   Lung sounds clear to auscultation bilaterally with equal chest expansion  CV:   Regular rate and rhythm; S1/S2; no murmur, rub, or gallop.  CHEST::External examination non-tender to palpation. and No abrasions or contusions  ABD:   Abdomen soft, nontender, and nondistended.  PELVIS: Non-tender to compression /palpation and No evidence of injury  GU/RECTAL: Normal external inspection.   BACK:  No evidence of injury, Non-tender to midline palpation, and no step-off  MS: Abnormal:   LUE  pain with range of motion in wirst, skin intactm compartments compressible   NEURO:  Alert and oriented to person, place and time.     GLASGOW COMA SCALE: Eye  opening: 4 spontaneous, Verbal resonse:  5 oriented, Best motor response:  6 obeys commands  VASCULAR:  DP/PT pulses palpable and equal bilaterally  INTEGUMENTARY:  Pink, warm, and dry  PSYCHOSOCIAL: Pleasant.  Normal affect.    DIAGNOSTIC STUDIES: Comment on "Positives"   Labs:  Lab Results Today:    Results for orders placed or performed during the  hospital encounter of 06/13/23 (from the past 24 hour(s))   PT/INR   Result Value Ref Range    PROTHROMBIN TIME 12.5 9.8 - 14.4 seconds    INR 1.03 0.80 - 1.20   PTT (PARTIAL THROMBOPLASTIN TIME)   Result Value Ref Range    APTT 22.5 (L) 24.2 - 37.5 seconds   ETHANOL, SERUM   Result Value Ref Range    ETHANOL None Detected     ETHANOL <10 <10 mg/dL   TYPE AND SCREEN   Result Value Ref Range    UNITS ORDERED NOT STATED     ABO/RH(D) A POSITIVE     ANTIBODY SCREEN NEGATIVE     SPECIMEN EXPIRATION DATE 06/16/2023,2359    BASIC METABOLIC PANEL   Result Value Ref Range    SODIUM 135 (L) 136 - 145 mmol/L    POTASSIUM 3.7 3.5 - 5.1 mmol/L    CHLORIDE 99 96 - 111 mmol/L    CO2 TOTAL 24 23 - 31 mmol/L    ANION GAP 12 4 - 13 mmol/L    CALCIUM 9.8 8.6 - 10.3 mg/dL    GLUCOSE 213 (H) 65 - 125 mg/dL    BUN 16 8 - 25 mg/dL    CREATININE 0.86 5.78 - 1.05 mg/dL    BUN/CREA RATIO 20 6 - 22    ESTIMATED GFR - FEMALE 80 >=60 mL/min/BSA   CBC WITH DIFF   Result Value Ref Range    WBC 12.7 (H) 3.7 - 11.0 x10^3/uL    RBC 3.64 (L) 3.85 - 5.22 x10^6/uL    HGB 10.6 (L) 11.5 - 16.0 g/dL    HCT 46.9 (L) 62.9 - 46.0 %    MCV 89.8 78.0 - 100.0 fL    MCH 29.1 26.0 - 32.0 pg    MCHC 32.4 31.0 - 35.5 g/dL    RDW-CV 52.8 41.3 - 24.4 %    PLATELETS 222 150 - 400 x10^3/uL    MPV 9.3 8.7 - 12.5 fL    NEUTROPHIL % 69.9 %    LYMPHOCYTE % 14.9 %    MONOCYTE % 11.3 %    EOSINOPHIL % 3.0 %    BASOPHIL % 0.6 %    NEUTROPHIL # 8.89 (H) 1.50 - 7.70 x10^3/uL    LYMPHOCYTE # 1.89 1.00 - 4.80 x10^3/uL    MONOCYTE # 1.44 (H) 0.20 - 1.10 x10^3/uL    EOSINOPHIL # 0.38 <=0.50 x10^3/uL    BASOPHIL # <0.10 <=0.20 x10^3/uL     IMMATURE GRANULOCYTE % 0.3 0.0 - 1.0 %    IMMATURE GRANULOCYTE # <0.10 <0.10 x10^3/uL   TROPONIN-I   Result Value Ref Range    TROPONIN-I HS 3.4 <=14.0 ng/L ng/L   BLOOD GAS W/ CO-OX, LYTES, LACTATE REFLEX Venous   Result Value Ref Range    %FIO2 (VENOUS) 21.0 %    PH (VENOUS) 7.40 7.32 - 7.43    PCO2 (VENOUS) 43 41 - 51 mm/Hg    PO2 (VENOUS) 40 mm/Hg    BICARBONATE (VENOUS) 25.3 22.0 - 29.0 mmol/L    BASE EXCESS 1.5 0.0 - 3.0 mmol/L    HEMOGLOBIN 11.0 (L) 12.0 - 18.0 g/dL    HEMATOCRITRT 33 (L) 37 - 50 %    OXYHEMOGLOBIN 59.8 %    CARBOXYHEMOGLOBIN 2.0 <=3.0 %    MET-HEMOGLOBIN <0.7 <=1.5 %    O2CT 9.3 %    O2 SATURATION (VENOUS) 74.9 %    SODIUM 133 (L) 136 - 145 mmol/L  HEMOLYSIS None None, Mild    WHOLE BLOOD POTASSIUM 3.8 3.5 - 5.1 mmol/L    CHLORIDE 100 98 - 107 mmol/L    IONIZED CALCIUM 1.21 1.15 - 1.33 mmol/L    GLUCOSE 136 (H) 65 - 125 mg/dL    LACTATE 2.2 (H) <=4.5 mmol/L   ECG 12-LEAD   Result Value Ref Range    Ventricular rate 87 BPM    Atrial Rate 87 BPM    PR Interval 148 ms    QRS Duration 74 ms    QT Interval 368 ms    QTC Calculation 442 ms    Calculated P Axis 51 degrees    Calculated R Axis 30 degrees    Calculated T Axis 42 degrees   LACTIC ACID - FIRST REFLEX   Result Value Ref Range    LACTIC ACID 1.5 0.5 - 2.2 mmol/L   POC BLOOD GLUCOSE (RESULTS)   Result Value Ref Range    GLUCOSE, POC 111 (H) 70 - 105 mg/dl   DRUG SCREEN, LOW OPIATE CUTOFF, NO CONFIRMATION, URINE   Result Value Ref Range    AMPHETAMINES, URINE Negative Negative    BARBITURATES URINE Negative Negative    BENZODIAZEPINES URINE Negative Negative    BUPRENORPHINE URINE Negative Negative    CANNABINOIDS URINE Negative Negative    COCAINE METABOLITES URINE Negative Negative    METHADONE URINE Negative Negative    OPIATES URINE (LOW CUTOFF) Negative Negative    OXYCODONE URINE Positive (A) Negative    ECSTASY/MDMA URINE Negative Negative    FENTANYL, RANDOM URINE Negative Negative    CREATININE RANDOM URINE 31 >=20 mg/dL    URINALYSIS, MACROSCOPIC   Result Value Ref Range    SPECIFIC GRAVITY 1.035 (H) 1.005 - 1.030    GLUCOSE >=1000 (A) Negative mg/dL    PROTEIN Negative Negative mg/dL    BILIRUBIN Negative Negative mg/dL    UROBILINOGEN Negative Negative mg/dL    PH 6.5 5.0 - 8.0    BLOOD Negative Negative mg/dL    KETONES 10 (A) Negative mg/dL    NITRITE Negative Negative    LEUKOCYTES Negative Negative WBCs/uL    APPEARANCE Clear Clear    COLOR Normal (Yellow) Normal (Yellow)   URINALYSIS, MICROSCOPIC   Result Value Ref Range    WBCS <1.0 <11.0 /hpf    RBCS 0.0 <6.0 /hpf    BACTERIA Occasional or less Occasional or less /hpf    SQUAMOUS EPITHELIAL CELLS Occasional or less Occasional or less /lpf   TRANSTHORACIC ECHOCARDIOGRAM - ADULT   Result Value Ref Range    FS 35 28 - 44 percent    LV Diastolic Volume Index 77.7 milliliter    LV Systolic Volume Index 27.4 milliliter    IVS 1.00 0.6 - 1.1 cm    LVIDD 4.18 3.5 - 6.0 cm    LVIDS 2.72 2.1 - 4.0 cm    LVOT diameter 2.08 cm    LVPWD 1.07 cm    LV Diastolic Volume Index 84.3 milliliter    LV Diastolic Volume Index 54.8 milliliter    LV Diastolic Volume Index 69.3 milliliter    LV Systolic Volume Index 9.3 milliliter    LV Systolic Volume Index 14.6 milliliter    LV Systolic Volume Index 11.9 milliliter    LVOT stroke volume 78.70 milliliter    Aortic Valve Area by Continuity of Peak Velocity 2.21 cm2    Aortic Valve Area by Continuity of VTI 2.41 cm2    MV Peak  A Vel 92.52 cm/s    MV Deceleration Time 213.05 ms    MV Peak E Vel 78.57 cm/s    Pulmonic Valve Acceleration Time 89.44 ms    TR VELOCITY 242.97 cm/s    TR VELOCITY 242.97 cm/s       Radiology:    OSH Films with reads-  CT Brain: Trace SAH   CT Face Negative   CT C-spine : C3 endplate fracture   LUE x-rays: distal radius fracture     Internal Studies-  XR WRIST LEFT    Result Date: 06/13/2023  [facility] Female, 72 years old. XR WRIST LEFT performed on 06/13/2023 3:56 PM. REASON FOR EXAM:  Post Reduction, Post Cast/Spint (Will  call XR when ready) TECHNIQUE: 4 views/4 images submitted for interpretation. COMPARISON:  06/13/2023     There has been interval splinting of the nondisplaced distal radius fracture with unchanged alignment.    ECG 12-LEAD    Result Date: 06/13/2023  Normal sinus rhythm Normal ECG No previous ECGs available    CT ANGIO CAROTID-EXTRACRANIAL (NECK) W IV CONTRAST    Result Date: 06/13/2023  [facility] Female, 72 years old. CT ANGIO CAROTID-EXTRACRANIAL (NECK) W IV CONTRAST performed on 06/13/2023 11:29 AM. REASON FOR EXAM:  c3 fracture RADIATION DOSE: 186.31 mGy.cm CONTRAST: 50 ml's of Isovue 370 TECHNIQUE: Standard CT angiographic acquisition was performed from the aortic arch through the skull base. Multiplanar reformatted reconstructions were performed. Bolus Quality :Adequate COMPARISON: Outside CT cervical spine same day, CT brain same day FINDINGS: Aortic Arch / Juanetta Beets Origins: Three-vessel aortic arch. Origin of great vessels are patent. Bilateral subclavian arteries are patent within the limits of this exam. VERTEBROBASILAR SYSTEM Vertebral Artery Dominance:Codominant Left Vertebral Artery: Left vertebral artery originates from the left subclavian artery is patent and of normal course and caliber throughout its intradural portion. Right Vertebral Artery: Right vertebral artery originates from the right subclavian artery is patent and of normal course and caliber throughout its intradural portion. RIGHT CAROTID SYSTEM RIGHT Common Carotid Artery: Patent at its origin and of normal course and caliber to the bifurcation. RIGHT Carotid Bifurcation/ICA: No evidence of bifurcation aneurysms or high-grade stenosis. Right ICA is patent to its intradural portion. LEFT CAROTID SYSTEM LEFT Common Carotid Artery: Patent at its origin and of normal course and caliber to the bifurcation. LEFT Carotid Bifurcation/ICA: No evidence of bifurcation aneurysms or high-grade stenosis. Left ICA is patent to its intradural portion      1. Patent extracranial arterial vasculature. 2. Previously seen C3 fracture is better assessed on dedicated CT cervical spine.     XR HAND LEFT 2 VIEW    Result Date: 06/13/2023  [facility] Female, 72 years old. XR HAND LEFT 2 VIEWS performed on 06/13/2023 10:51 AM. REASON FOR EXAM:  Fall TECHNIQUE: 2 views/2 images submitted for interpretation. COMPARISON:  Same day radiographs     Nondisplaced fracture of the left distal radius     XR WRIST LEFT 2 VIEW    Result Date: 06/13/2023  [facility] Female, 72 years old. XR WRIST LEFT 2 VIEW performed on 06/13/2023 10:52 AM. REASON FOR EXAM:  Fall TECHNIQUE: 2 views/2 images submitted for interpretation. COMPARISON:  Same day radiographs     Left distal radial nondisplaced fracture with no definitive intra-articular extension.     XR FOREARM LEFT    Result Date: 06/13/2023  [facility] Female, 73 years old. XR FOREARM LEFT 2 OR MORE VIEWS performed on 06/13/2023 10:51 AM. REASON FOR  EXAM:  Trauma, r/o fracture/dislocation TECHNIQUE: 2 views/2 images submitted for interpretation. COMPARISON:  06/13/2023 same day radiographs     Left distal radial fracture better seen on same day radiographs of the hand and wrist. Diffuse mineralization of bone.    CT CHEST ABDOMEN PELVIS WO IV CONTRAST    Result Date: 06/13/2023  [facility] Female, 72 years old. CT CHEST ABDOMEN PELVIS WO IV CONTRAST performed on 06/13/2023 10:16 AM. REASON FOR EXAM:  spine pain, fall RADIATION DOSE: 443.98 mGy.cm COMPARISON: None FINDINGS:  Lungs are clear. No pneumothorax or pleural effusion or pulmonary edema seen. There is some atherosclerotic plaque in the aorta and coronary arteries. Patient has had a cholecystectomy. No biliary dilatation seen. Pancreas shows some atrophy. Kidneys have bilateral parapelvic cysts. No stones or hydronephrosis identified. There is a moderate stool burden in the colon. No bowel obstruction or perforation seen. Appendix appears unremarkable. There is no evidence for acute  fracture or dislocation. Mild degenerative changes present.     No acute traumatic injury identified.    XR PELVIS    Result Date: 06/13/2023  [facility] Female, 72 years old. XR PELVIS performed on 06/13/2023 10:01 AM. REASON FOR EXAM:  Pelvic Injury TECHNIQUE: 1 views/1 images submitted for interpretation. COMPARISON:  None     There is normal bony architecture and alignment. Soft tissues appear unremarkable. No acute fracture or dislocation seen.       Incidental findings: No    Patient Management:   none    Consults Ordered     Consult Orders Placed This Encounter   Procedures    IP CONSULT TO NEUROSURGERY - ORDERING PROVIDER MUST CALL/PAGE SERVICE    IP CONSULT TO ORTHOPAEDICS    IP CONSULT TO SICU - ORDERING PROVIDER MUST CALL/PAGE SERVICE    IP CONSULT TO CARE MANAGEMENT - EVALUATION SICU ADMIT       Assessment & Plan/Recommendations:      Active Hospital Problems   (*Primary Problem)    Diagnosis    *Fall    SAH (subarachnoid hemorrhage) (CMS HCC)    Radius fracture    Closed fracture of third cervical vertebra (CMS HCC)    Tachycardia, paroxysmal (CMS HCC)       Plan:  Admit to TES under the service of  Cherly Hensen, MD  to ICU  C3 Fracture    - CMS intact    - CTA Negative    - Ortho spine consulted     - concern for C4-5 extension injury - MRI recommended - ordered     - surgical plan pending MRI   Left distal radius    - ortho consulted    - splinted, NWB, 1 week follow up   TSAH    - not on anticoagulation/antiplatelet medications    - NSG consulted     - Repeat CT Brain ordered     - Keppra neuro check    Tachycardia, paroxysmal    - troponin negative at outside facility - repeat negative    - electrolytes WNL    - EKG NSR on repeat    - ECHO ordered    - continue telemetry     Admit to SICU   SICU consulted    Loney Loh, APRN,FNP-BC     Trauma/Surgical Critical Care/Acute Care Surgery Staff    I personally saw and examined the patient.  I personally reviewed all relevant labs and imaging.  This  patient was discussed on multidisciplinary rounds  with development of the plan of care.  Please see advanced practice provider note for additional details.   I agree with the findings and plan of care as documented in the their note. The date of the signature is simply the date the note was electronically signed , the patient was seen on the date of service indicated in the note.       Fall from standing  Outside imaging showed SAH, C3 fx and left distal radius fx  On arrival, patient was HD normal, GCS 15  CTA extracranial negative for BCVI  In CT scan patient noted to have episodes of SVT that resolved spontaneously.  Denies any hx  NSGY consulted  Ortho and ortho spine consulted  Plan for admission to SICU      Electronically Signed by:    Glee Arvin. Rubye Oaks, MD  Assistant Professor of Surgery  Trauma, Surgical Critical Care, and Acute Care Surgery  [facility]  06/14/2023, 15:47

## 2023-06-13 NOTE — ED Nurses Note (Signed)
ortho at bedside

## 2023-06-13 NOTE — Incoming ED Transfer Note (Signed)
Traumatic R SAH, C3 fx, left distal radius fx  0400 - tripped over luggage, hit bath tub  Soft C-Collar in place  Left wrist splint    20g LAC    Mag 1.5 - replaced 400mg  PO  1g IV tylenol    Allergies - bactrim, compazine, penicillin    A&Ox4  Ambulatory x1    94  120/60  36.9  16  98% RA

## 2023-06-13 NOTE — Consults (Signed)
[facility]  Neurosurgery Consult  Initial Consult Note      Haley Mccall, Haley Mccall, 72 y.o. female  Date of Admission:  06/13/2023  Date of Service: 06/13/2023  Date of Birth:  1951/01/03    Primary Service: trauma  Requesting Faculty: Effie Shy  Reason for Consult: R sylvian fissure tSAH    Information Obtained from: patient, health care provider, and history reviewed via medical record  Chief Complaint: GLF    HPI:  Haley Mccall is a 72 y.o., Unknown female who presents after fall found to have small volume tSAH within R sylvian fissure. She woke up to go to the bathroom this AM when she tripped over her luggage and fell into the jacuzzi. She denies LOC. She reports a mild headache and LUE/LLE pain, otherwise denies nausea, vomiting, blurry vision, double vision, dizziness/lightheadedness, or numbness/weakness/tingling in extremities.     Denies use of anticoagulant or antiplatelet medications. No known AC/AP medications documented per chart review.    Other injuries include C3 fx and L radius fx.     ROS:  Other than ROS in the HPI, all other systems were negative.    PAST MEDICAL/ FAMILY/ SOCIAL HISTORY:   No past medical history on file.      Medications Prior to Admission       None           D5W 250 mL flush bag, , Intravenous, Q15 Min PRN  electrolyte-A (PLASMALYTE-A) premix infusion, , Intravenous, Continuous  levETIRAcetam (KEPPRA) tablet, 500 mg, Oral, 2x/day  NS 250 mL flush bag, , Intravenous, Q15 Min PRN  NS flush syringe, 2-6 mL, Intracatheter, Q8HRS  NS flush syringe, 2-6 mL, Intracatheter, Q1 MIN PRN  perflutren lipid microspheres (DEFINITY) 1.3 mL in NS 10 mL (tot vol) injection, 2 mL, Intravenous, Give in Cardiology      Allergies   Allergen Reactions    Bactrim [Sulfamethoxazole-Trimethoprim]     Compazine [Prochlorperazine]     Penicillins           Past Family History:   Family Medical History:    None         PHYSICAL EXAMINATION:  Constitutional  Temperature: (!) 35.9 C (96.7 F)  Heart Rate:  87  BP (Non-Invasive): (!) 134/53  Respiratory Rate: 19  SpO2: 97 %  Appears stated age, NAD  A&Ox3  GCS 4 6 5   Fluent speech  Appropriate fund of knowledge  Appropriate attention span & concentration  Appropriate recent and remote memory  CN 2 PERRL  CN 3 4 6  EOMI  CN 5 V1-V3 sensation intact  CN 7 Face symmetric  CN 8 Hearing grossly intact  CN 11 Shrug symmetric  CN 12 Tongue midline  Muscle Strength 5/5 BUE and BLE except pain limited   Muscle tone WNL  SILT BUE and BLE  No drift  No hoffman   No clonus     Labs Ordered/ Reviewed :   Reviewed: Labs:  I have reviewed all lab results.    Current Comorbid Conditions - Neurosurgery Consult  Severe Brain Conditions: NA  Coma less than 8: Coma -Not applicable  Loss of Consciousness: no  Encephalopathy No   Stroke: Not applicable  Craniectomy: no  Seizure: Seizure-Not applicable  Respiratory Conditions: None  Renal Failure: No  Acute Blood Loss Anemia: Monitoring for   Coagulopathy: Not applicable  Acid-Base Disorders: Monitoring for  Yes: Hyponatremia  Hemiplegia/Hemiparesis/Paraplegia: No   Fatigue/Debility: No Weakness     Impression/Recommendations  Haley Bene  Mccall is a 72 y.o., female who presents after fall found to have small volume tSAH within R sylvian fissure    -- No acute neurosurgical intervention  -- Please repeat CT brain wo 12 hours after initial   -- SBP goals <160 mmHg  -- Na goals eunatremia  -- Keppra 500 mg 2x/day x 7 days, end-date 6/23  -- No anti-coagulant or anti-platelet medications  -- No activity restrictions from a neurosurgery standpoint     -- Imaging   -- CT-brain wo (06/13/23): ORDERED    -- CT-brain wo (06/13/23): Small volume R sylvian fissure tSAH    Please call with any questions or concerns.    Leonor Liv, PA-C 06/13/2023 10:57  Department of Neurosurgery - Neurotrauma    I independently of the faculty provider spent a total of (36) minutes in direct/indirect care of this patient including initial evaluation, review of laboratory,  radiology, diagnostic studies, review of medical record, order entry and coordination of care.

## 2023-06-13 NOTE — H&P (Signed)
[facility]  SICU ADMISSION  HISTORY and PHYSICAL    ID: Haley Mccall  Date of Admission:  06/13/2023  Date of Birth:  12-03-1951  Date of Service: 06/13/2023  PCP: No primary care provider on file.  Primary Attending:  [name],*  Primary Service:  Trauma  Information Obtained from: Healthcare worker, EMR     Chief Complaint: Fall    HPI: This is a 72 y.o. female with PMH of DM, peripheral neuropathy, GERD, HTN HLD presenting after ground level fall. She woke up this morning and was walking to the bathroom when she tripped over luggage and fell into the [place] bathtub in her bathroom. She was complaining of headache, neck and arm pain. Denies anticoagulation or antiplatelet medications.     In the ED she was found to have R sylvian fissure tSAH, C3 fracture, C5-C6 extension injury, nondisplaced L distal radius fracture. Had several runs of paroxysmal SVT and tachycardia in ED and CT.     ROS:    ROS Other than ROS in the HPI, all other systems were negative.    PAST MEDICAL/ FAMILY/ SOCIAL HISTORY:   No past medical history on file.  T2DM, HTN, GERD, HLD    Allergies   Allergen Reactions    Bactrim [Sulfamethoxazole-Trimethoprim]     Compazine [Prochlorperazine]     Penicillins      Medications Prior to Admission       Prescriptions    aMILoride-hydroCHLOROthiazide (MODURETIC) 5-50 mg Oral Tablet    Take 1 Tablet by mouth Once a day    amitriptyline (ELAVIL) 10 mg Oral Tablet    Take 1 Tablet (10 mg total) by mouth Every night    amLODIPine (NORVASC) 2.5 mg Oral Tablet    Take 1 Tablet (2.5 mg total) by mouth Once a day    cholecalciferol, vitamin D3, 50 mcg (2,000 unit) Oral Tablet    Take 1 Tablet (2,000 Units total) by mouth Once a day    cyanocobalamin (VITAMIN B 12) 1,000 mcg Oral Tablet    Take 1 Tablet (1,000 mcg total) by mouth Once a day    dulaglutide (TRULICITY) 1.5 mg/0.5 mL Subcutaneous Pen Injector    Inject 0.5 mL (1.5 mg total) under the skin Every 7 days     empagliflozin/metformin HCl (EMPAGLIFLOZIN-METFORMIN ORAL)    Take 12.5 mg by mouth Once a day    gabapentin (NEURONTIN) 100 mg Oral Capsule    Take 1 Capsule (100 mg total) by mouth Twice daily    losartan (COZAAR) 100 mg Oral Tablet    Take 1 Tablet (100 mg total) by mouth Once a day    magnesium chloride (SLOW-MAG) 64 mg Oral Tablet, Delayed Release (E.C.)    Take 1 Tablet (64 mg total) by mouth Once a day    omeprazole (PRILOSEC) 20 mg Oral Capsule, Delayed Release(E.C.)    Take 1 Capsule (20 mg total) by mouth Once a day    PARoxetine (PAXIL) 30 mg Oral Tablet    Take 1 Tablet (30 mg total) by mouth Every morning    rosuvastatin (CRESTOR) 10 mg Oral Tablet    Take 1 Tablet (10 mg total) by mouth Every evening           acetaminophen (TYLENOL) tablet, 650 mg, Oral, Q6H  amitriptyline (ELAVIL) tablet, 10 mg, Oral, NIGHTLY  [START ON 06/14/2023] amLODIPine (NORVASC) tablet, 2.5 mg, Oral, Daily  [START ON 06/14/2023] cholecalciferol (VITAMIN D3) 1000 unit (25 mcg) tablet,  1,000 Units, Oral, Daily  cyanocobalamin (VITAMIN B12) tablet, 1,000 mcg, Oral, Daily  D5W 250 mL flush bag, , Intravenous, Q15 Min PRN  electrolyte-A (PLASMALYTE-A) premix infusion, , Intravenous, Continuous  gabapentin (NEURONTIN) capsule, 100 mg, Oral, 2x/day  hydrALAZINE (APRESOLINE) injection 10 mg, 10 mg, Intravenous, Q15 Min PRN  levETIRAcetam (KEPPRA) tablet, 500 mg, Oral, 2x/day  [Held by provider] losartan (COZAAR) tablet, 100 mg, Oral, Daily  NS 250 mL flush bag, , Intravenous, Q15 Min PRN  NS flush syringe, 2-6 mL, Intracatheter, Q8HRS  NS flush syringe, 2-6 mL, Intracatheter, Q1 MIN PRN  oxyCODONE (ROXICODONE) immediate release tablet, 5 mg, Oral, Q4H PRN   Or  oxyCODONE (ROXICODONE) immediate release tablet, 10 mg, Oral, Q4H PRN  pantoprazole (PROTONIX) delayed release tablet, 40 mg, Oral, NIGHTLY  PARoxetine (PAXIL) tablet 30 mg, 30 mg, Oral, QAM  rosuvastatin (CRESTOR) tablet, 10 mg, Oral, QPM  sennosides-docusate sodium (SENOKOT-S)  8.6-50mg  per tablet, 1 Tablet, Oral, 2x/day  SSIP insulin R human (HumuLIN R) 100 units/mL injection, 0-12 Units, Subcutaneous, Q6H PRN  [START ON 06/14/2023] triamterene-hydroCHLOROthiazide (MAXZIDE) 75-50mg  per tablet, 1 Tablet, Oral, Daily      Past cervical spine C5C6 fusion, parotidectomy, C section x2, tonsillectomy, wisdom teeth, R trigger finger release, carpal tunnel release, L cubital tunnel release,  R rotator cuff repair, bilateral plantar fascial operation, cholecystectomy       Family Medical History:    None              PHYSICAL EXAMINATION:    Constitutional: Temperature: 37.1 C (98.8 F)  BP (Non-Invasive): (!) 115/56  Heart Rate: 83  SpO2: 95 %  General:  pleasant, conversational, NAD  Eyes:  EOMI  HEENT:  Upper eyelid w evolving ecchymosis and edema. No abrasions.   Lungs:  Unlabored respiration on RA  Cardiovascular:  RRR, intact distal pulses  Abdomen:  NT ND  Extremities:  LUE in splint. Sensation intact to distal fingers. Cap refill 2+  Skin:  No lacerations or abrasions noted  Neurologic:  conversational, 5/5 strength BUE and BLE, GCS 15    Labs Ordered/ Reviewed    Reviewed: Labs:  Lab Results for Last 24 Hours:    Results for orders placed or performed during the hospital encounter of 06/13/23 (from the past 24 hour(s))   PT/INR   Result Value Ref Range    PROTHROMBIN TIME 12.5 9.8 - 14.4 seconds    INR 1.03 0.80 - 1.20   PTT (PARTIAL THROMBOPLASTIN TIME)   Result Value Ref Range    APTT 22.5 (L) 24.2 - 37.5 seconds   ETHANOL, SERUM   Result Value Ref Range    ETHANOL None Detected     ETHANOL <10 <10 mg/dL   TYPE AND SCREEN   Result Value Ref Range    UNITS ORDERED NOT STATED     ABO/RH(D) A POSITIVE     ANTIBODY SCREEN NEGATIVE     SPECIMEN EXPIRATION DATE 06/16/2023,2359    BASIC METABOLIC PANEL   Result Value Ref Range    SODIUM 135 (L) 136 - 145 mmol/L    POTASSIUM 3.7 3.5 - 5.1 mmol/L    CHLORIDE 99 96 - 111 mmol/L    CO2 TOTAL 24 23 - 31 mmol/L    ANION GAP 12 4 - 13 mmol/L     CALCIUM 9.8 8.6 - 10.3 mg/dL    GLUCOSE 161 (H) 65 - 125 mg/dL    BUN 16 8 - 25 mg/dL    CREATININE  0.79 0.60 - 1.05 mg/dL    BUN/CREA RATIO 20 6 - 22    ESTIMATED GFR - FEMALE 80 >=60 mL/min/BSA   CBC WITH DIFF   Result Value Ref Range    WBC 12.7 (H) 3.7 - 11.0 x10^3/uL    RBC 3.64 (L) 3.85 - 5.22 x10^6/uL    HGB 10.6 (L) 11.5 - 16.0 g/dL    HCT 09.8 (L) 11.9 - 46.0 %    MCV 89.8 78.0 - 100.0 fL    MCH 29.1 26.0 - 32.0 pg    MCHC 32.4 31.0 - 35.5 g/dL    RDW-CV 14.7 82.9 - 56.2 %    PLATELETS 222 150 - 400 x10^3/uL    MPV 9.3 8.7 - 12.5 fL    NEUTROPHIL % 69.9 %    LYMPHOCYTE % 14.9 %    MONOCYTE % 11.3 %    EOSINOPHIL % 3.0 %    BASOPHIL % 0.6 %    NEUTROPHIL # 8.89 (H) 1.50 - 7.70 x10^3/uL    LYMPHOCYTE # 1.89 1.00 - 4.80 x10^3/uL    MONOCYTE # 1.44 (H) 0.20 - 1.10 x10^3/uL    EOSINOPHIL # 0.38 <=0.50 x10^3/uL    BASOPHIL # <0.10 <=0.20 x10^3/uL    IMMATURE GRANULOCYTE % 0.3 0.0 - 1.0 %    IMMATURE GRANULOCYTE # <0.10 <0.10 x10^3/uL   TROPONIN-I   Result Value Ref Range    TROPONIN-I HS 3.4 <=14.0 ng/L ng/L   BLOOD GAS W/ CO-OX, LYTES, LACTATE REFLEX Venous   Result Value Ref Range    %FIO2 (VENOUS) 21.0 %    PH (VENOUS) 7.40 7.32 - 7.43    PCO2 (VENOUS) 43 41 - 51 mm/Hg    PO2 (VENOUS) 40 mm/Hg    BICARBONATE (VENOUS) 25.3 22.0 - 29.0 mmol/L    BASE EXCESS 1.5 0.0 - 3.0 mmol/L    HEMOGLOBIN 11.0 (L) 12.0 - 18.0 g/dL    HEMATOCRITRT 33 (L) 37 - 50 %    OXYHEMOGLOBIN 59.8 %    CARBOXYHEMOGLOBIN 2.0 <=3.0 %    MET-HEMOGLOBIN <0.7 <=1.5 %    O2CT 9.3 %    O2 SATURATION (VENOUS) 74.9 %    SODIUM 133 (L) 136 - 145 mmol/L    HEMOLYSIS None None, Mild    WHOLE BLOOD POTASSIUM 3.8 3.5 - 5.1 mmol/L    CHLORIDE 100 98 - 107 mmol/L    IONIZED CALCIUM 1.21 1.15 - 1.33 mmol/L    GLUCOSE 136 (H) 65 - 125 mg/dL    LACTATE 2.2 (H) <=1.3 mmol/L   ECG 12-LEAD   Result Value Ref Range    Ventricular rate 87 BPM    Atrial Rate 87 BPM    PR Interval 148 ms    QRS Duration 74 ms    QT Interval 368 ms    QTC Calculation 442 ms     Calculated P Axis 51 degrees    Calculated R Axis 30 degrees    Calculated T Axis 42 degrees   LACTIC ACID - FIRST REFLEX   Result Value Ref Range    LACTIC ACID 1.5 0.5 - 2.2 mmol/L   POC BLOOD GLUCOSE (RESULTS)   Result Value Ref Range    GLUCOSE, POC 111 (H) 70 - 105 mg/dl   DRUG SCREEN, LOW OPIATE CUTOFF, NO CONFIRMATION, URINE   Result Value Ref Range    AMPHETAMINES, URINE Negative Negative    BARBITURATES URINE Negative Negative    BENZODIAZEPINES URINE Negative Negative  BUPRENORPHINE URINE Negative Negative    CANNABINOIDS URINE Negative Negative    COCAINE METABOLITES URINE Negative Negative    METHADONE URINE Negative Negative    OPIATES URINE (LOW CUTOFF) Negative Negative    OXYCODONE URINE Positive (A) Negative    ECSTASY/MDMA URINE Negative Negative    FENTANYL, RANDOM URINE Negative Negative    CREATININE RANDOM URINE 31 >=20 mg/dL   URINALYSIS, MACROSCOPIC   Result Value Ref Range    SPECIFIC GRAVITY 1.035 (H) 1.005 - 1.030    GLUCOSE >=1000 (A) Negative mg/dL    PROTEIN Negative Negative mg/dL    BILIRUBIN Negative Negative mg/dL    UROBILINOGEN Negative Negative mg/dL    PH 6.5 5.0 - 8.0    BLOOD Negative Negative mg/dL    KETONES 10 (A) Negative mg/dL    NITRITE Negative Negative    LEUKOCYTES Negative Negative WBCs/uL    APPEARANCE Clear Clear    COLOR Normal (Yellow) Normal (Yellow)   URINALYSIS, MICROSCOPIC   Result Value Ref Range    WBCS <1.0 <11.0 /hpf    RBCS 0.0 <6.0 /hpf    BACTERIA Occasional or less Occasional or less /hpf    SQUAMOUS EPITHELIAL CELLS Occasional or less Occasional or less /lpf   TRANSTHORACIC ECHOCARDIOGRAM - ADULT   Result Value Ref Range    FS 35 28 - 44 percent    LV Diastolic Volume Index 77.7 milliliter    LV Systolic Volume Index 27.4 milliliter    IVS 1.00 0.6 - 1.1 cm    LVIDD 4.18 3.5 - 6.0 cm    LVIDS 2.72 2.1 - 4.0 cm    LVOT diameter 2.08 cm    LVPWD 1.07 cm    LV Diastolic Volume Index 84.3 milliliter    LV Diastolic Volume Index 54.8 milliliter    LV  Diastolic Volume Index 69.3 milliliter    LV Systolic Volume Index 9.3 milliliter    LV Systolic Volume Index 14.6 milliliter    LV Systolic Volume Index 11.9 milliliter    LVOT stroke volume 78.70 milliliter    Aortic Valve Area by Continuity of Peak Velocity 2.21 cm2    Aortic Valve Area by Continuity of VTI 2.41 cm2    MV Peak A Vel 92.52 cm/s    MV Deceleration Time 213.05 ms    MV Peak E Vel 78.57 cm/s    Pulmonic Valve Acceleration Time 89.44 ms    TR VELOCITY 242.97 cm/s    TR VELOCITY 242.97 cm/s       Radiology Tests Ordered/ Reviewed     Results for orders placed or performed during the hospital encounter of 06/13/23 (from the past 24 hour(s))   XR PELVIS     Status: None    Narrative    Torii Tuohey  Female, 72 years old.    XR PELVIS performed on 06/13/2023 10:01 AM.    REASON FOR EXAM:  Pelvic Injury    TECHNIQUE: 1 views/1 images submitted for interpretation.    COMPARISON:  None      Impression    There is normal bony architecture and alignment. Soft tissues appear unremarkable. No acute fracture or dislocation seen.   CT CHEST ABDOMEN PELVIS WO IV CONTRAST     Status: None    Narrative    Emry Santillana  Female, 72 years old.    CT CHEST ABDOMEN PELVIS WO IV CONTRAST performed on 06/13/2023 10:16 AM.    REASON FOR EXAM:  spine pain, fall  RADIATION DOSE: 443.98 mGy.cm    COMPARISON: None    FINDINGS:  Lungs are clear. No pneumothorax or pleural effusion or pulmonary edema seen. There is some atherosclerotic plaque in the aorta and coronary arteries.    Patient has had a cholecystectomy. No biliary dilatation seen. Pancreas shows some atrophy. Kidneys have bilateral parapelvic cysts. No stones or hydronephrosis identified.    There is a moderate stool burden in the colon. No bowel obstruction or perforation seen. Appendix appears unremarkable.    There is no evidence for acute fracture or dislocation. Mild degenerative changes present.      Impression    No acute traumatic injury identified.   XR FOREARM  LEFT     Status: None    Narrative    Aricka Mcbeth  Female, 72 years old.    XR FOREARM LEFT 2 OR MORE VIEWS performed on 06/13/2023 10:51 AM.    REASON FOR EXAM:  Trauma, r/o fracture/dislocation    TECHNIQUE: 2 views/2 images submitted for interpretation.    COMPARISON:  06/13/2023 same day radiographs      Impression    Left distal radial fracture better seen on same day radiographs of the hand and wrist. Diffuse mineralization of bone.   XR HAND LEFT 2 VIEW     Status: None    Narrative    Rufus Ryner  Female, 72 years old.    XR HAND LEFT 2 VIEWS performed on 06/13/2023 10:51 AM.    REASON FOR EXAM:  Fall    TECHNIQUE: 2 views/2 images submitted for interpretation.    COMPARISON:  Same day radiographs      Impression    Nondisplaced fracture of the left distal radius       XR WRIST LEFT 2 VIEW     Status: None    Narrative    Candita Venuto  Female, 72 years old.    XR WRIST LEFT 2 VIEW performed on 06/13/2023 10:52 AM.    REASON FOR EXAM:  Fall    TECHNIQUE: 2 views/2 images submitted for interpretation.    COMPARISON:  Same day radiographs      Impression    Left distal radial nondisplaced fracture with no definitive intra-articular extension.       CT ANGIO CAROTID-EXTRACRANIAL (NECK) W IV CONTRAST     Status: None    Narrative    Ellee Hourihan  Female, 72 years old.    CT ANGIO CAROTID-EXTRACRANIAL (NECK) W IV CONTRAST performed on 06/13/2023 11:29 AM.    REASON FOR EXAM:  c3 fracture  RADIATION DOSE: 186.31 mGy.cm  CONTRAST: 50 ml's of Isovue 370    TECHNIQUE: Standard CT angiographic acquisition was performed from the aortic arch through the skull base. Multiplanar reformatted reconstructions were performed.    Bolus Quality :Adequate    COMPARISON: Outside CT cervical spine same day, CT brain same day    FINDINGS:   Aortic Arch / Juanetta Beets Origins:   Three-vessel aortic arch. Origin of great vessels are patent. Bilateral subclavian arteries are patent within the limits of this exam.    VERTEBROBASILAR  SYSTEM    Vertebral Artery Dominance:Codominant    Left Vertebral Artery:  Left vertebral artery originates from the left subclavian artery is patent and of normal course and caliber throughout its intradural portion.    Right Vertebral Artery:  Right vertebral artery originates from the right subclavian artery is patent and of normal course and caliber throughout its intradural portion.  RIGHT CAROTID SYSTEM    RIGHT Common Carotid Artery: Patent at its origin and of normal course and caliber to the bifurcation.    RIGHT Carotid Bifurcation/ICA: No evidence of bifurcation aneurysms or high-grade stenosis. Right ICA is patent to its intradural portion.    LEFT CAROTID SYSTEM    LEFT Common Carotid Artery: Patent at its origin and of normal course and caliber to the bifurcation.    LEFT Carotid Bifurcation/ICA: No evidence of bifurcation aneurysms or high-grade stenosis. Left ICA is patent to its intradural portion        Impression    1. Patent extracranial arterial vasculature.  2. Previously seen C3 fracture is better assessed on dedicated CT cervical spine.             ASSESSMENT & PLAN:       Active Hospital Problems    Diagnosis    Primary Problem: Fall    SAH (subarachnoid hemorrhage) (CMS HCC)       Jessalee Borrell is a 72 y.o. female who is s/p GLF with C3 inferior osteophyte fracture with concern for C4-C5 extension injury, L distal radius fx. Had several runs of SVT and tachycardia in ED     Lines:     Patient Lines/Drains/Airways Status       Active Line / Dialysis Catheter / Dialysis Graft / Drain / Airway / Wound       Name Placement date Placement time Site Days    Peripheral IV Right Median Cubital  (antecubital fossa) 06/13/23  0856  -- less than 1    Peripheral IV Ultrasound guided Ultrasound Guided Right Cephalic  (lateral side of arm) 06/13/23  1500  -- less than 1                       NEURO:  GCS: E4=Spontaneous (Opens Eyes on Own) M6=Normal (Follows Simple Commands) V5=Normal  Conversation  Imaging: CT brain (outside hospital) trace SAH  Sz prophylaxis:  Keppra 500 mg BIP x 7d  Sedation/analgesia: MMPC: tylenol, oxy  Neurochecks q1hr    SBP < 160  Na goal eunaatremia    tSAH  -Keppra as above  -SBP control     -hydralazine PRN  -Home amlodipine, maxzide    -Repeat CT brain @1830  ordered    CARDIOVASCULAR:  Systolic (24hrs), Avg:125 , Min:101 , Max:138     Diastolic (24hrs), Avg:59, Min:50, Max:73         Troponins: No results found for: "CKMB"   Meds: Home Crestor, Norvasc, maxzide (formulary substitution for amiloride-HCTZ)    HTN  HLD  Tachydysrhythmia on arrival to ED  -troponin negative  -ECG NSR  -Monitor electrolytes  -continuous cardiac monitoring  -Home medications as above      PULMONARY:  Airway Ventilator Settings     Not on Ventilator   SpO2  Avg: 96.8 %  Min: 91 %  Max: 99 %  Blood Gas:  Recent Labs     06/13/23  0958   FI02 21.0   PH 7.40   PCO2 43   PO2 40   BICARBONATE 25.3   BASEEXCESS 1.5     Nebs: N/A    Distant smoking history, reports no home nebulizers  No acute concerns  Continuous pulse oximetry    GI:  Diet: MNT PROTOCOL FOR DIETITIAN  DIET NPO - NOW   Recent Labs     06/13/23  1523   BILIRUBIN Negative     Last  BM: Date of Last Bowel Movement:  (PTA)  Prophylaxis: Home Protonix    NPO awaiting operative plans this evening    RENAL/GU:  Recent Labs     06/13/23  0956 06/13/23  0958 06/13/23  1523   SODIUM 135* 133*  --    POTASSIUM 3.7  --   --    CHLORIDE 99 100  --    BICARBONATE  --  25.3  --    BUN 16  --   --    CREATININE 0.79  --   --    GLUCOSE 131* 136* >=1000*   ANIONGAP 12  --   --    CALCIUM 9.8  --   --        I/O last 24 hours:    Intake/Output Summary (Last 24 hours) at 06/13/2023 1601  Last data filed at 06/13/2023 1600  Gross per 24 hour   Intake 230 ml   Output 750 ml   Net -520 ml     I/O current shift:  06/17 0700 - 06/17 1859  In: 230 [I.V.:230]  Out: 750 [Urine:750]    IV fluids: Plasmalyte @ 50 ml/hr  Diuretics: HCTZ     I/O  q4h    HEME:  Recent Labs     06/13/23  0956   HGB 10.6*   HCT 32.7*   PLTCNT 222   APTT 22.5*   INR 1.03     Transfusions:   Prophylaxis: SCD's, pending OR plans    ID:  Temp (24hrs) Max:37.1 C (98.8 F)    Recent Labs     06/13/23  0956   WBC 12.7*   PMNS 69.9     Blood cultures:  N/A  Urine cultures:  N/A  BAL:  N/A  OR cultures:  N/A  ABX: N/A    Monitor for fevers, trend WBC  Leukocytosis likely reactive in absence of symptoms    ENDO:  No results for input(s): "GLUCOSEPOC" in the last 24 hours.    T2DM  SSI Protocol       MSK:  Fractures: L distal radius, C3 osteophyte  -Ortho consulted   -Radius splinted, f/u in clinic     -CCWB LUE    No skin breakdown    OTHER:  Activity: As tolerated w assist, CCWB LUE,  reverse trendelenburg   PT/OT:  ordered  MNT:  ordered    Isaias Cowman, MD  06/13/2023, 15:26     ______________________________________________________________________________  SICU Attending    I personally saw and examined the patient.  I reviewed the mid-level/resident's note.  I agree with the findings and plan of care as documented in the their note. The date of the signature is simply the date the note was electronically signed , the patient was seen on the date of service indicated in the note.     Any exceptions/additions are edited/noted, my findings/participation:    47F s/p mechanical fall with C spine injury, neuro intact, with SVT/tachyarrythmias in ED prompting admission to SICU for ongoing monitoring.  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  Continue tele monitoring, optimize electrolytes.      Critical Care Attestation    I was present at the bedside of this critically ill patient for 35 minutes exclusive of procedures.  This patient suffers from failure or dysfunction of Neurologic/Sensory and Cardiovascular system(s).  The care of this patient was in regard to  managing (a) conditions(s) that has a high  probability of sudden, clinically significant, or life-threatening deterioration and required a high degree of Attending Physician attention and direct involvement to intervene urgently. Data review and care planning was performed in direct proximity of the patient, examination was performed in direct contact with the patient. All of this time was exclusive of procedure which will be documented elsewhere in the chart.    My critical care time is independent and unique to other providers (no other providers saw patient for purposes of SICU evaluation)  My critical care time involved full attention to the patient's condition and included:    Review of nursing notes and/or old charts  Review of medications, allergies, and vital signs  Documentation time  Consultant collaboration on findings and treatment options  Care, transfer of care, and discharge plans  Ordering, interpreting, and reviewing diagnostic studies/lab tests  Obtaining necessary history from family, EMS, nursing staff and/or treating physicians    My critical care time did not include time spent teaching resident physician(s) or other services of resident physicians, or performing other reported procedures.  Total Critical Care Time: 35 minutes    Melanee Spry, MD

## 2023-06-13 NOTE — Consults (Signed)
Neurosurgery  06/13/2023  10:35    Haley Mccall  [id number]  [birth year], 72 y.o., female    Patient with traumatic brain injury - images reviewed 10:35.  No acute neurosurgical intervention.   Full history and physical from neurosurgery to follow.      Susy Manor, MD  Neurosurgery PGY1

## 2023-06-13 NOTE — Pharmacy (Signed)
Pharmacy Medication Reconciliation    Patient Name: Haley Mccall, Haley Mccall  Date of Service: 06/13/2023  Date of Admission: 06/13/2023  Date of Birth: 07/04/1951  Length of Stay:   0 days   Service: TRAUMA BLUE      Transitions of Care:  Discharge Pharmacy services were discussed with the patient and the Meds to [facility] flowsheet and preferred pharmacy information were updated in EMR if applicable and able to assess with patient.    Information was collected from:  Patient and Pharmacy  [facility] [phone]    Does the Patient have Prescription Coverage? Yes    Clarified Prior to Admission Medications:  Prior to Admission medications    Medication Sig Taking Resumed Y/N (RPh) Comments   aMILoride-hydroCHLOROthiazide (MODURETIC) 5-50 mg Oral Tablet Take 1 Tablet by mouth Once a day Yes       amitriptyline (ELAVIL) 10 mg Oral Tablet Take 1 Tablet (10 mg total) by mouth Every night Yes       amLODIPine (NORVASC) 2.5 mg Oral Tablet Take 1 Tablet (2.5 mg total) by mouth Once a day Yes       cholecalciferol, vitamin D3, 50 mcg (2,000 unit) Oral Tablet Take 1 Tablet (2,000 Units total) by mouth Once a day Yes       cyanocobalamin (VITAMIN B 12) 1,000 mcg Oral Tablet Take 1 Tablet (1,000 mcg total) by mouth Once a day Yes       dulaglutide (TRULICITY) 1.5 mg/0.5 mL Subcutaneous Pen Injector Inject 0.5 mL (1.5 mg total) under the skin Every 7 days Yes    Sunday no pharmacy record    empagliflozin/metformin HCl (EMPAGLIFLOZIN-METFORMIN ORAL) Take 12.5 mg by mouth Once a day Yes       gabapentin (NEURONTIN) 100 mg Oral Capsule Take 1 Capsule (100 mg total) by mouth Twice daily Yes    5.23.24 30 day    losartan (COZAAR) 100 mg Oral Tablet Take 1 Tablet (100 mg total) by mouth Once a day Yes       magnesium chloride (SLOW-MAG) 64 mg Oral Tablet, Delayed Release (E.C.) Take 1 Tablet (64 mg total) by mouth Once a day Yes       omeprazole (PRILOSEC) 20 mg Oral Capsule, Delayed Release(E.C.) Take 1 Capsule (20 mg total) by mouth Once a day Yes        PARoxetine (PAXIL) 30 mg Oral Tablet Take 1 Tablet (30 mg total) by mouth Every morning Yes       rosuvastatin (CRESTOR) 10 mg Oral Tablet Take 1 Tablet (10 mg total) by mouth Every evening Yes           Did patient's home medication list require updates? Yes    Medications UPDATED on Prior to Admission Med List:  none    Medications ADDED to Prior to Admission Med List:  all above medications and supplements      Allergies:    Allergies   Allergen Reactions    Bactrim [Sulfamethoxazole-Trimethoprim]     Compazine [Prochlorperazine]     Penicillins        Deatra Canter, Pharmacy Technician    Did pharmacist make suggestions for medication reconciliation? Yes    Medications REMOVED from home medication list:  none  Pharmacist Recommendations:  Medications confirmed as above and may be restarted as appropriate. Of Note: we do not stock the combination products of Moduretic (amiloride/HCTZ) nor (empaglifozin/metformin) so those drugs will need to be ordered as the components individually when resumed. Pt was not on  Maxide pta (or is this being substituted for the Moduretic?). Magnesium will need to be ordered as mag oxide if Mag low during this admission.    Tretha Sciara. Nena Jordan, PharmD

## 2023-06-13 NOTE — Ancillary Notes (Signed)
[facility] [employer]  MRI Technologist Note        MRI has been completed.        Barbette Reichmann, RT (R)(MR) 06/13/2023, 23:30
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
## 2023-06-13 NOTE — Nurses Notes (Signed)
Pt arrived from ED to SICU01. Vitals and assessment per flowsheet.

## 2023-06-14 ENCOUNTER — Encounter (HOSPITAL_COMMUNITY): Payer: Self-pay | Admitting: SURGICAL CRITICAL CARE

## 2023-06-14 ENCOUNTER — Inpatient Hospital Stay (HOSPITAL_COMMUNITY): Payer: Medicare Other

## 2023-06-14 DIAGNOSIS — Z794 Long term (current) use of insulin: Secondary | ICD-10-CM

## 2023-06-14 DIAGNOSIS — I10 Essential (primary) hypertension: Secondary | ICD-10-CM

## 2023-06-14 DIAGNOSIS — W010XXA Fall on same level from slipping, tripping and stumbling without subsequent striking against object, initial encounter: Secondary | ICD-10-CM

## 2023-06-14 DIAGNOSIS — I499 Cardiac arrhythmia, unspecified: Secondary | ICD-10-CM

## 2023-06-14 DIAGNOSIS — I471 Supraventricular tachycardia, unspecified (CMS HCC): Secondary | ICD-10-CM

## 2023-06-14 DIAGNOSIS — M2578 Osteophyte, vertebrae: Secondary | ICD-10-CM

## 2023-06-14 DIAGNOSIS — F32A Depression, unspecified: Secondary | ICD-10-CM

## 2023-06-14 DIAGNOSIS — F419 Anxiety disorder, unspecified: Secondary | ICD-10-CM

## 2023-06-14 DIAGNOSIS — E1140 Type 2 diabetes mellitus with diabetic neuropathy, unspecified: Secondary | ICD-10-CM

## 2023-06-14 DIAGNOSIS — M47812 Spondylosis without myelopathy or radiculopathy, cervical region: Secondary | ICD-10-CM

## 2023-06-14 LAB — CBC
HCT: 31.6 % — ABNORMAL LOW (ref 34.8–46.0)
HGB: 10.2 g/dL — ABNORMAL LOW (ref 11.5–16.0)
MCH: 29.3 pg (ref 26.0–32.0)
MCHC: 32.3 g/dL (ref 31.0–35.5)
MCV: 90.8 fL (ref 78.0–100.0)
MPV: 9.9 fL (ref 8.7–12.5)
PLATELETS: 216 10*3/uL (ref 150–400)
RBC: 3.48 10*6/uL — ABNORMAL LOW (ref 3.85–5.22)
RDW-CV: 13.7 % (ref 11.5–15.5)
WBC: 10.6 10*3/uL (ref 3.7–11.0)

## 2023-06-14 LAB — EXTENDED RESPIRATORY VIRUS PANEL
ADENOVIRUS ARRAY: NOT DETECTED
BORDETELLA PARAPERTUSSIS (IS 1001): NOT DETECTED
BORDETELLA PERTUSSIS ARRAY: NOT DETECTED
CHLAMYDOPHILA PNEUMONIAE ARRAY: NOT DETECTED
CORONAVIRUS 229E: NOT DETECTED
CORONAVIRUS HKU1: NOT DETECTED
CORONAVIRUS NL63: NOT DETECTED
CORONAVIRUS OC43: NOT DETECTED
INFLUENZA A: NOT DETECTED
INFLUENZA B ARRAY: NOT DETECTED
METAPNEUMOVIRUS ARRAY: NOT DETECTED
MYCOPLASMA PNEUMONIAE ARRAY: NOT DETECTED
PARAINFLUENZA 1 ARRAY: NOT DETECTED
PARAINFLUENZA 2 ARRAY: NOT DETECTED
PARAINFLUENZA 3 ARRAY: NOT DETECTED
PARAINFLUENZA 4 ARRAY: NOT DETECTED
RHINOVIRUS/ENTEROVIRUS ARRAY: DETECTED — AB
RSV ARRAY: NOT DETECTED
SARS CORONAVIRUS 2 (SARS-CoV-2): NOT DETECTED

## 2023-06-14 LAB — POC BLOOD GLUCOSE (RESULTS)
GLUCOSE, POC: 83 mg/dl (ref 70–105)
GLUCOSE, POC: 88 mg/dl (ref 70–105)

## 2023-06-14 LAB — BASIC METABOLIC PANEL
ANION GAP: 14 mmol/L — ABNORMAL HIGH (ref 4–13)
BUN/CREA RATIO: 19 (ref 6–22)
BUN: 14 mg/dL (ref 8–25)
CALCIUM: 8.5 mg/dL — ABNORMAL LOW (ref 8.6–10.3)
CHLORIDE: 101 mmol/L (ref 96–111)
CO2 TOTAL: 21 mmol/L — ABNORMAL LOW (ref 23–31)
CREATININE: 0.73 mg/dL (ref 0.60–1.05)
ESTIMATED GFR - FEMALE: 88 mL/min/BSA (ref >=60)
GLUCOSE: 78 mg/dL (ref 65–125)
POTASSIUM: 3.7 mmol/L (ref 3.5–5.1)
SODIUM: 136 mmol/L (ref 136–145)

## 2023-06-14 LAB — ECG 12-LEAD
Atrial Rate: 87 {beats}/min
Calculated P Axis: 51 degrees
Calculated R Axis: 30 degrees
Calculated T Axis: 42 degrees
PR Interval: 148 ms
QRS Duration: 74 ms
QT Interval: 368 ms
QTC Calculation: 442 ms
Ventricular rate: 87 {beats}/min

## 2023-06-14 LAB — PHOSPHORUS: PHOSPHORUS: 2.7 mg/dL (ref 2.3–4.0)

## 2023-06-14 LAB — MAGNESIUM: MAGNESIUM: 1.5 mg/dL — ABNORMAL LOW (ref 1.8–2.6)

## 2023-06-14 MED ORDER — LOSARTAN 50 MG TABLET
100.0000 mg | ORAL_TABLET | Freq: Every day | ORAL | Status: DC
Start: 2023-06-14 — End: 2023-06-16
  Administered 2023-06-14 – 2023-06-15 (×2): 100 mg via ORAL
  Filled 2023-06-14 (×2): qty 2

## 2023-06-14 MED ORDER — AMOXICILLIN 875 MG-POTASSIUM CLAVULANATE 125 MG TABLET
1.0000 | ORAL_TABLET | Freq: Two times a day (BID) | ORAL | Status: DC
Start: 2023-06-14 — End: 2023-06-15
  Administered 2023-06-14 (×2): 1 via ORAL
  Filled 2023-06-14 (×3): qty 1

## 2023-06-14 MED ORDER — PSEUDOEPHEDRINE 30 MG TABLET
30.0000 mg | ORAL_TABLET | Freq: Four times a day (QID) | ORAL | Status: DC | PRN
Start: 2023-06-14 — End: 2023-06-16
  Administered 2023-06-14 – 2023-06-15 (×4): 30 mg via ORAL
  Filled 2023-06-14 (×4): qty 1

## 2023-06-14 MED ORDER — GUAIFENESIN 100 MG/5 ML ORAL LIQUID
200.0000 mg | ORAL | Status: DC | PRN
Start: 2023-06-14 — End: 2023-06-16
  Administered 2023-06-15 – 2023-06-16 (×3): 200 mg via ORAL
  Filled 2023-06-14 (×3): qty 10

## 2023-06-14 MED ORDER — METHOCARBAMOL 500 MG TABLET
500.0000 mg | ORAL_TABLET | Freq: Four times a day (QID) | ORAL | Status: DC
Start: 2023-06-14 — End: 2023-06-16
  Administered 2023-06-14 (×3): 500 mg via ORAL
  Administered 2023-06-15: 0 mg via ORAL
  Administered 2023-06-15 – 2023-06-16 (×5): 500 mg via ORAL
  Filled 2023-06-14 (×8): qty 1

## 2023-06-14 MED ORDER — POTASSIUM BICARBONATE-CITRIC ACID 20 MEQ EFFERVESCENT TABLET
20.0000 meq | EFFERVESCENT_TABLET | ORAL | Status: AC
Start: 2023-06-14 — End: 2023-06-14
  Administered 2023-06-14: 20 meq via ORAL
  Filled 2023-06-14: qty 1

## 2023-06-14 MED ORDER — POTASSIUM BICARBONATE-CITRIC ACID 20 MEQ EFFERVESCENT TABLET
40.0000 meq | EFFERVESCENT_TABLET | ORAL | Status: DC
Start: 2023-06-14 — End: 2023-06-14

## 2023-06-14 MED ORDER — LIDOCAINE 3.6 %-MENTHOL 1.25 % TOPICAL PATCH
1.0000 | MEDICATED_PATCH | Freq: Every day | CUTANEOUS | Status: DC
Start: 2023-06-14 — End: 2023-06-16
  Administered 2023-06-14 – 2023-06-16 (×3): 1 via TRANSDERMAL
  Filled 2023-06-14 (×3): qty 1

## 2023-06-14 MED ORDER — GABAPENTIN 100 MG CAPSULE
200.0000 mg | ORAL_CAPSULE | Freq: Two times a day (BID) | ORAL | Status: DC
Start: 2023-06-14 — End: 2023-06-16
  Administered 2023-06-14 – 2023-06-16 (×4): 200 mg via ORAL
  Filled 2023-06-14 (×4): qty 2

## 2023-06-14 MED ORDER — MAGNESIUM OXIDE 400 MG (241.3 MG MAGNESIUM) TABLET
400.0000 mg | ORAL_TABLET | Freq: Once | ORAL | Status: AC
Start: 2023-06-14 — End: 2023-06-14
  Administered 2023-06-14: 400 mg via ORAL
  Filled 2023-06-14: qty 1

## 2023-06-14 MED ORDER — HYDROMORPHONE (PF) 0.5 MG/0.5 ML INJECTION SYRINGE
0.2000 mg | INJECTION | INTRAMUSCULAR | Status: DC | PRN
Start: 2023-06-14 — End: 2023-06-14
  Administered 2023-06-14: 0.2 mg via INTRAVENOUS
  Filled 2023-06-14: qty 0.5

## 2023-06-14 MED ORDER — POLYETHYLENE GLYCOL 3350 17 GRAM ORAL POWDER PACKET
17.0000 g | Freq: Two times a day (BID) | ORAL | Status: DC
Start: 2023-06-14 — End: 2023-06-16
  Administered 2023-06-14 (×2): 17 g via ORAL
  Administered 2023-06-15 (×2): 0 g via ORAL
  Administered 2023-06-16: 17 g via ORAL
  Filled 2023-06-14 (×5): qty 1

## 2023-06-14 NOTE — Care Management Notes (Signed)
[facility]  Care Management Initial Evaluation    Patient Name: Haley Mccall  Date of Birth: [birth year]  Sex: female  Date/Time of Admission: 06/13/2023  9:52 AM  Room/Bed: 17/A  Payor: MEDICARE / Plan: MEDICARE PART A AND B / Product Type: Medicare /   Primary Care Providers:  [facility]* (General)    Pharmacy Info:   Preferred Pharmacy       [id number] - [place], [place] - [address]    [address] [place] [address]    Phone: [phone] Fax: [phone]    Hours: Not open 24 hours          Emergency Contact Info:   Extended Emergency Contact Information  Primary Emergency Contact: Haley Mccall  Mobile Phone: [phone]  Relation: Husband    History:   Dyna Dwight is a 72 y.o., female, admitted with fall    Height/Weight: 165.1 cm (5\' 5" ) / 71.5 kg (157 lb 10.1 oz)     LOS: 1 day   Admitting Diagnosis: Fall [W19.XXXA]    Assessment:      06/14/23 1130   Assessment Details   Assessment Type Admission   Date of Care Management Update 06/14/23   Date of Next DCP Update 06/17/23   Insurance Information/Type   Insurance type Medicare   Employment/Financial   Patient has Prescription Coverage?  Yes        Name of Insurance Coverage for Medications MEDICARE   Financial Concerns none   Living Environment   Select an age group to open "lives with" row.  Adult   Lives With spouse   Living Arrangements house   Able to Return to Prior Arrangements yes   Home Safety   Home Assessment: Stairs in Home   Home Accessibility bed and bath on same level;bed and bath are not on the first floor;stairs within home;stairs to enter home;stairs (1 railing present)   Care Management Plan   Discharge Planning Status initial meeting   Projected Discharge Date 06/17/23   Discharge plan discussed with: Patient   CM will evaluate for rehabilitation potential yes   Discharge Needs Assessment   Equipment Currently Used at Home glucometer   Equipment Needed After Discharge shower  chair   Discharge Facility/Level of Care Needs Home with DME (code 1)   Transportation Available car;family or friend will provide   Referral Information   Admission Type inpatient   Address Verified verified-no changes   Arrived From acute hospital, other   Acute Care Facility [facility]   ADVANCE DIRECTIVES   Does the Patient have an Advance Directive? Yes, Patient Does Have Advance Directive for Healthcare Treatment   Copy of Advance Directives in Chart? No, Copy Requested From Patient   Patient Requests Assistance in Having Advance Directive Notarized. N/A   LAY CAREGIVER    Appointed Lay Caregiver? I Decline   Stairs Within Home, Primary   Stair Railings, Within Home, Primary railing on left side (ascending)   Number of Stairs, Within Home, Primary other (see comments)  (14 STEPS)   Stairs, Within Home, Primary 14 STEPS   Home Main Entrance   Stair Railings, Main Entrance none   Number of Stairs, Main Entrance six     72 year old female admitted from [facility] with a fall. PMH per H&P. This admission, patient ordered labs, imaging, geriatric medicine consult, PT/OT, orthopaedics consult, neurosurg consult, and ortho spine consult.  Discharge Plan:  Home with DME (code 1)  Patient lives with spouse in a 2 story home with 6 entry steps with no railing. 14 steps to access the upstairs level with no railing. Spouse is able to provide assistance after discharge. Patient uses glucometer DME at home and no HH agency. Patient PCP is Wynelle Fanny, MD in [place] and patient last seen in office about 6 weeks ago. Patient uses [facility] and has prescription drug coverage. Patient manages medications. Patient has MPOA/Living Will, copy requested. PT/OT recs pending.     The patient will continue to be evaluated for developing discharge needs.     Case Manager: Verdie Mosher, CASE MANAGER  Phone: [phone]

## 2023-06-14 NOTE — Nurses Notes (Signed)
No falls this shift. Pt. VSS, A&O x4 sitter select pad in use, call bell within reach. Calm and cooperative with staff this shift. Pt. OOB with Ax1. Minimal complaints of pain at this time. Reg diet. POC reviewed with pt. at bedside. Patient continues to work toward discharge goals. Will continue to monitor.
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
## 2023-06-14 NOTE — Nurses Notes (Signed)
Report called to [facility] RN. No questions at this time. Room still being cleaned

## 2023-06-14 NOTE — Care Plan (Signed)
[facility]  Rehabilitation Services  Occupational Therapy Initial Evaluation    Patient Name: Haley Mccall  Date of Birth: 09/10/1951  Height: Height: 165.1 cm (5\' 5" )  Weight: Weight: 71.5 kg (157 lb 10.1 oz)  Room/Bed: 01/A  Payor: MEDICARE / Plan: MEDICARE PART A AND B / Product Type: Medicare /     Assessment:   Haley Mccall tolerated OT eval session fairly well this date. Despite CCWB status in LUE, pt remained functional throughout with all observed OOB activity; however, noted instances of postural sway, specifically during turns, requiring CGA-MIN A & HHA at times for support. Pt demo'd good participation with completion of BADLs this session but presents with mild deficits in balance, coordination, activity tolerance, strength and postural control. As pt has multiple stairs upon entry, pt will require stair training next session. Pt is not yet d/c ready but hopeful pt to progress & is expected to return home with assistance pending stair training and further OOB activity. Pt would benefit from use of shower chair within home during the recovery process to INC safety and IND.    Discharge Needs:   Equipment Recommendation: shower chair    The patient presents with mobility limitations due to impaired balance, impaired range of motion, impaired strength, weight bearing restrictions, and impaired functional activity tolerance that significantly impair/prevent patient'Mccall ability to participate in mobility-related activities of daily living (MRADLs) including  bathing. This functional mobility deficit can be sufficiently resolved with the use of a shower chair in order to decrease the risk of falls, morbidity, and mortality in performance of these MRADLs.  Patient is able to safely use this assistive device.    Discharge Disposition: home with assist (pending stair training and progression with therapy)  JUSTIFICATION OF DISCHARGE RECOMMENDATION   Based on current diagnosis, functional performance prior  to admission, and current functional performance, this patient requires continued OT services in home with assist (pending stair training and progression with therapy)  in order to achieve significant functional improvements.  The above recommendation is based upon the current examination and evaluation performed on this date. As subsequent sessions are completed, recommendations will be updated accordingly.  Plan:   Current Intervention: ADL retraining, IADL retraining, balance training, bed mobility training, endurance training, fine motor coordination training, joint mobilization, ROM (range of motion), stretching, strengthening, therapeutic exercise, transfer training    To provide Occupational therapy services 1x/day, minimum of 1x/week, minimum of 2x/week, until discharge, until goals are met.     The risks/benefits of therapy have been discussed with the patient/caregiver and he/she is in agreement with the established plan of care.     Subjective & Objective        06/14/23 1203   Therapist Pager   OT Assigned/ Pager # Haley Mccall. [phone]   Rehab Session   Document Type evaluation   OT Visit Date 06/14/23   Total OT Minutes: 28   Patient Effort good   Symptoms Noted During/After Treatment fatigue;dizziness   General Information   Patient Profile Reviewed yes   Onset of Illness/Injury or Date of Surgery 06/13/23   General Observations of Patient pt seen with PT for clustered care in SICU this date. Pt alert and agreeable for therapy.   Pertinent History of Current Functional Problem "This is a 72 y.o. female with PMH of DM, peripheral neuropathy, GERD, HTN HLD presenting after ground level fall. She woke up this morning and was walking to the bathroom when she tripped over luggage  and fell into the Jacuzzi bathtub in her bathroom. She was complaining of headache, neck and arm pain. Denies anticoagulation or antiplatelet medications."   Medical Lines PIV Line;Telemetry   Respiratory Status face mask;room  air  (removed mask for OOB activity; replaced post activity)   Existing Precautions/Restrictions full code;fall precautions;droplet isolation;c-collar;weight bearing restriction  (CCWB LUE)   Pre Treatment Status   Pre Treatment Patient Status Patient sitting in bedside chair or w/c;Call light within reach;Telephone within reach;Nurse approved session   Support Present Pre Treatment  None   Communication Pre Treatment  Nurse   Communication Pre Treatment Comment RN medically cleared pt for therapy   Mutuality/Individual Preferences   Anxieties, Fears or Concerns none mentioned   Individualized Care Needs OOB Ax1   Patient-Specific Goals (Include Timeframe) to improve functionally   Plan of Care Reviewed With patient   Patient would like to participate in bedside shift report Yes   Living Environment   Lives With spouse   Living Arrangements house   Home Assessment: Stairs in Home   Home Accessibility stairs within home;stairs to enter home;tub/shower is not walk in;bed and bath are not on the first floor;grab bars present (bathtub)   Living Environment Comment lives with spouse in multi-story home with 14 STE from garage. Pt has additional 14 steps to reach bed/bath where she will have access to tub/shower with grab bars.   Home Main Entrance   Stair Railings, Main Entrance railing on left side (ascending)   Number of Stairs, Main Entrance other (see comments)  (14)   Stairs Within Home, Primary   Landing, Stairs, Within Home, Primary railings present  (1HR at times; states there is an opening where HR is not present)   Stairs, Within Home, Primary 14 to reach bed/bath   Functional Level Prior   Ambulation 0 - independent   Transferring 0 - independent   Toileting 0 - independent   Bathing 0 - independent   Dressing 0 - independent   Eating 0 - independent   Communication 0 - understands/communicates without difficulty   Swallowing 0-->swallows foods/liquids without difficulty   Prior Functional Level Comment fully  IND and active with ADLs/IADLs at baseline; no DME use   Self-Care   Equipment Currently Used at Home no   Activity/Exercise/Self-Care Comment has access to crutches if needed but doesn't currently use   Vital Signs   Pre-Treatment Heart Rate (beats/min) 82   Post-treatment Heart Rate (beats/min) 86   Pre-Treatment Resp Rate (breaths/min) 8   Post-treatment Resp Rate (breaths/min) 14   Pre Treatment BP 117/53   Post Treatment BP 145/62   Pre SpO2 (%) 94   O2 Delivery Pre Treatment supplemental O2   Post SpO2 (%) 99   O2 Delivery Post Treatment supplemental O2   Vitals Comment removed mask for OOB activity; O2 remained >95% with all OOB activity. Replaced mask post activity for comfort.   Pain Assessment   Additional Documentation Pain Scale: Word Pre/Post-Treatment (Group)   Pain Scale: Word Pre/Post-Treatment   Pre/Posttreatment Pain Comment reported "not much" when asked about pain rating.   Coping/Psychosocial Response Interventions   Plan Of Care Reviewed With patient   Trust Relationship/Rapport care explained;choices provided;questions encouraged;questions answered;reassurance provided   Cognitive Assessment/Interventions   Behavior/Mood Observations cooperative;alert;behavior appropriate to situation, WNL/WFL   Orientation Status oriented x 4   Attention WNL/WFL   Follows Commands WNL   RUE Assessment   RUE Assessment WFL- Within Functional Limits   LUE Assessment  LUE Assessment X-Exceptions   LUE ROM limited in elbow/wrist/digits secondary to splint; shoulder is WFL   LUE Strength limited by CCWB status   LUE Coordination intact seen with function   Mobility Assessment/Training   Mobility Comment ambulated ~64ft with CGA & HHA at times, noting postural sway at times with 1 LOB, requiring CGA-MIN A to correct. maybe benefit from DME to improve functional IND and safety with mobility related tasks; will issue as appropriate.   Bed Mobility Assessment/Treatment   Bed Mobility, Assistive Device Head of Bed  Elevated   Sit to Supine, Independence stand-by assistance   Safety Issues impaired trunk control for bed mobility   Impairments ROM decreased;endurance   Transfer Assessment/Treatment   Sit-Stand Independence contact guard assist   Stand-Sit Independence contact guard assist   Sit-Stand-Sit, Assist Device side by side   Toilet Transfer Independence minimum assist (75% patient effort)   Toilet Transfer Assist Device handheld assist   Transfer Safety Issues balance decreased during turns;weight-shifting ability decreased   Transfer Impairments balance impaired;coordination impaired;endurance;postural control impaired;strength decreased   Transfer Comment noted postural sway at times with 1 LOB at bedside, requiring CGA-MIN A to correct.  (able to maintain WB status with MIN cueing)   ADL Assessment/Intervention   ADL Comments educated on WB restrictions with pt returning good carryover skills.   Toileting Assessment/Training   Position sitting   TOILETING ASSESSED Adjust clothing after;Adjust clothing prior;Perineal hygiene   Independence Level  standby assist   Impairments activity tolerance impaired;coordination impaired;strength decreased;ROM decreased   Grooming/Oral Hygeine  Assessment/Training   Position standing   Independence Level standby assist   Impairments activity tolerance impaired;coordination impaired;strength decreased   Comment able to complete oral care while standing at sink, able to manage all objects and supplies w/o physical (A)   Balance   Comment HHA at times   Sitting Balance: Static good balance   Sitting, Dynamic (Balance) fair + balance   Sit-to-Stand Balance fair balance   Standing Balance: Static fair balance   Standing Balance: Dynamic fair - balance   Systems Impairment Contributing to Balance Disturbance musculoskeletal   Identified Impairments Contributing to Balance Disturbance impaired coordination;impaired postural control;decreased strength   Post Treatment Status   Post  Treatment Patient Status Patient supine in bed;Call light within reach;Telephone within reach   Support Present Post Treatment  None   Communication Post Treatement Nurse   Communication Post Treatment Comment pt performance   Care Plan Goals   OT Rehab Goals Occupational Therapy Goal;Grooming Goal;Toileting Goal;Transfer Training Goal 2   Occupational Therapy Goals   OT Goal, Date Established 06/14/23   OT Goal, Time to Achieve by discharge   OT Goal, Activity Type to ambulate household distances mod(I) using LRD to promote IND with mobility related ADLs/IADLs   Grooming Goal   Grooming Goal, Date Established 06/14/23   Grooming Goal, Time to Achieve by discharge   Grooming Goal, Activity Type all grooming tasks   Grooming Goal, Independence  modified independence   Toileting Goal   Toileting Goal, Date Established 06/14/23   Toileting Goal, Time to Achieve by discharge   Toileting Goal, Activity Type all toileting tasks   Toileting Goal, Independence Level modified independence   Transfer Training Goal 2   Transfer Training Goal, Date Established 06/14/23   Transfer Training Goal, Time to Achieve by discharge   Transfer Training Goal, Activity Type bed-to-chair/chair-to-bed;shower chair;toilet;sit-to-stand/stand-to-sit   Transfer Training Goal, Independence Level modified independence   Transfer Training Goal, Assist  Device other (see comments)  (LRD)   Planned Therapy Interventions, OT Eval   Planned Therapy Interventions ADL retraining;IADL retraining;balance training;bed mobility training;endurance training;fine motor coordination training;joint mobilization;ROM (range of motion);stretching;strengthening;therapeutic exercise;transfer training   Functional Impairment   Overall Functional Impairments/Problem List balance impaired;coordination impaired;endurance;flexibility decreased;postural control impaired;ROM decreased;strength decreased   Clinical Impression   Functional Level at Time of Session Ms. Demir  tolerated OT eval session fairly well this date. Despite CCWB status in LUE, pt remained functional throughout with all observed OOB activity; however, noted instances of postural sway, specifically during turns, requiring CGA-MIN A & HHA at times for support. Pt demo'd good participation with completion of BADLs this session but presents with mild deficits in balance, coordination, activity tolerance, strength and postural control. As pt has multiple stairs upon entry, pt will require stair training next session. Pt is not yet d/c ready but hopeful pt to progress & is expected to return home with assistance pending stair training and further OOB activity. Pt would benefit from use of shower chair within home during the recovery process to INC safety and IND.   Criteria for Skilled Therapeutic Interventions Met (OT) yes;skilled treatment is necessary   Rehab Potential good   Therapy Frequency 1x/day;minimum of 1x/week;minimum of 2x/week   Predicted Duration of Therapy until discharge;until goals are met   Anticipated Equipment Needs at Discharge shower chair   Anticipated Discharge Disposition home with assist  (pending stair training and progression with therapy)   Highest level of Mobility score   Exercise/Activity Level Performed 7- Walked 25 feet or more   Evaluation Complexity Justification   Occupational Profile Review Expanded review   Performance Deficits 3-5 deficits   Clinical Decision Making Moderate analytic complexity   Evaluation Complexity Moderate     OT Education: Patient/family educated on Role of OT at [facility], energy conservation strategies during ADL, falls prevention, home safety strategies, importance of participating in OOB activity and ADL routine during hospital stay.  Patient/family verbalized/demonstrated initial understanding, would benefit from reinforcement to ensure carry over    Therapist:   Elijah Birk, OT   Pager #: [phone]

## 2023-06-14 NOTE — Consults (Signed)
[facility]                                                      Trauma Progress Note                 Date of Birth:  Jan 23, 1951  Date of Admission:  06/13/2023  Date of service: 06/14/2023    Haley Mccall, 72 y.o., female Post trauma day 1 status post fall.    Hospital Course:   6/18: P2 Transfer from Outside [facility], s/p mechanical FFS, CT Brain, CT C-spine, CT face, and x-rays at outside facility, found to have R sylvian fissure tSAH, C3 fracture, C5-C6 extension injury, nondisplaced L distal radius fracture. Had several runs of paroxysmal SVT and tachycardia in ED and CT. Admit to SICU.   6/19: Repeat CT brain stable, non-op ortho plans, transfer to SD, reg diet, pain control, geri consult, PT/OT     Events over the last 24 hours have included:  See above    Subjective:    Patient reports nasal congestion/sinus pain and neck pain. States neck pain is well controlled and improved by current pain regimen. States congestion is not improved since z-pack PTA and is irritating. Denies abd pain or distention. Patient denies CP, SOB, n/v/d, abd pain, paresthesias, or any other new or worsening symptoms. Denies new or additional areas of pain.     Objective   24 Hour Summary:    Filed Vitals:    06/14/23 0900 06/14/23 1000 06/14/23 1100 06/14/23 1200   BP: (!) 123/58 (!) 115/52 (!) 117/53    Pulse: 84 83 83    Resp: 13 13 15     Temp:    36.7 C (98.1 F)   SpO2: 97% (!) 89% 90%      Labs:  Recent Labs     06/13/23  0956 06/13/23  0958 06/13/23  1523 06/14/23  0022   WBC 12.7*  --   --  10.6   HGB 10.6*  --   --  10.2*   HCT 32.7*  --   --  31.6*   SODIUM 135* 133*  --  136   POTASSIUM 3.7  --   --  3.7   CHLORIDE 99 100  --  101   BICARBONATE  --  25.3  --   --    BUN 16  --   --  14   CREATININE 0.79  --   --  0.73   GLUCOSE 131* 136* >=1000* 78   ANIONGAP 12  --   --  14*   CALCIUM 9.8  --   --  8.5*   MAGNESIUM  --   --   --  1.5*   PHOSPHORUS  --   --   --  2.7   INR 1.03  --   --   --       In: 1225 [I.V.:1225]  Out: 1300 [Urine:1300]  Nutrition Management: MNT PROTOCOL FOR DIETITIAN  DIET REGULAR Date of Last Bowel Movement: 06/12/23 (PTA)  No results for input(s): "ALBUMIN", "PREALBUMIN" in the last 72 hours.  Current Medications:  acetaminophen (TYLENOL) tablet, 650 mg, Oral, Q6H  albuterol (PROVENTIL) 2.5 mg / 3 mL (0.083%) neb solution, 2.5 mg, Nebulization, Q4H PRN  amitriptyline (ELAVIL) tablet, 10 mg, Oral, NIGHTLY  amLODIPine (NORVASC) tablet, 2.5 mg, Oral, Daily  amoxicillin-clavulanate (AUGMENTIN) 875-125mg  per tablet, 1 Tablet, Oral, 2x/day  cholecalciferol (VITAMIN D3) 1000 unit (25 mcg) tablet, 1,000 Units, Oral, Daily  cyanocobalamin (VITAMIN B12) tablet, 1,000 mcg, Oral, Daily  D5W 250 mL flush bag, , Intravenous, Q15 Min PRN  gabapentin (NEURONTIN) capsule, 200 mg, Oral, 2x/day  guaiFENesin 100mg  per 5mL oral liquid - for cough (expectorant), 200 mg, Oral, Q4H PRN  hydrALAZINE (APRESOLINE) injection 10 mg, 10 mg, Intravenous, Q15 Min PRN  levETIRAcetam (KEPPRA) tablet, 500 mg, Oral, 2x/day  lidocaine-menthol (LIDOPATCH) 3.6%-1.25% patch, 1 Patch, Transdermal, Daily  losartan (COZAAR) tablet, 100 mg, Oral, Daily  methocarbamol (ROBAXIN) tablet, 500 mg, Oral, 4x/day  NS 250 mL flush bag, , Intravenous, Q15 Min PRN  NS flush syringe, 2-6 mL, Intracatheter, Q8HRS  NS flush syringe, 2-6 mL, Intracatheter, Q1 MIN PRN  oxyCODONE (ROXICODONE) immediate release tablet, 5 mg, Oral, Q4H PRN   Or  oxyCODONE (ROXICODONE) immediate release tablet, 10 mg, Oral, Q4H PRN  pantoprazole (PROTONIX) delayed release tablet, 40 mg, Oral, NIGHTLY  PARoxetine (PAXIL) tablet 30 mg, 30 mg, Oral, QPM  polyethylene glycol (MIRALAX) oral packet, 17 g, Oral, 2x/day  pseudoephedrine (SUDAFED) tablet, 30 mg, Oral, Q6H PRN  rosuvastatin (CRESTOR) tablet, 10 mg, Oral, QPM  sennosides-docusate sodium (SENOKOT-S) 8.6-50mg  per tablet, 1 Tablet, Oral, 2x/day  SSIP insulin R human (HumuLIN R) 100 units/mL injection, 0-12  Units, Subcutaneous, Q6H PRN  triamterene-hydroCHLOROthiazide (MAXZIDE) 75-50mg  per tablet, 1 Tablet, Oral, Daily      Today's Physical Exam:  GEN: NAD  HEENT: Normocephalic, atraumatic, full ROM w/o pain or neurologic changes  NECK: Non-tender to palpation, full ROM w/o pain or neurologic changes  PULM: Lung sounds clear to ausculation bilaterally. Normal respiratory effort.  CV: Regular rate and rhythm  ABD: Abdomen soft, non-tender, and non-distended  MS: Atraumatic. Distal pulses intact. Normal strength and ROM in all extremities.   Neuro: Alert and oriented to person, place, and time  Vascular: All pulses palpable and equal bilaterally   Integumentary: pink, warm, dry  Wound/incision: Wound margins intact and healing well. No signs of ecchymosis, hyergranulation, hernia, or drainage    Assessment/ Plan:   Active Hospital Problems   (*Primary Problem)    Diagnosis    *Fall    SAH (subarachnoid hemorrhage) (CMS HCC)    Radius fracture    Closed fracture of third cervical vertebra (CMS HCC)    Tachycardia, paroxysmal (CMS HCC)     DVT prophylaxis:  SCDs/ Venodynes/Impulse boots  Anticoagulants (last 24 hours)       None          Nutrition: MNT PROTOCOL FOR DIETITIAN  DIET REGULAR diet, Date of Last Bowel Movement: 06/12/23 (PTA)  Activity: OOB with assist   Pain:   Analgesics (last 24 hours)       Date/Time Action Medication Dose    06/14/23 0844 Given    oxyCODONE (ROXICODONE) immediate release tablet 10 mg    06/14/23 0843 Given    acetaminophen (TYLENOL) tablet 650 mg    06/14/23 0404 Given    oxyCODONE (ROXICODONE) immediate release tablet 10 mg    06/14/23 0025 Given    acetaminophen (TYLENOL) tablet 650 mg    06/14/23 0025 Given    HYDROmorphone (DILAUDID) 0.5 mg/0.5 mL injection 0.2 mg    06/13/23 2129 Given    oxyCODONE (ROXICODONE) immediate release tablet 10 mg    06/13/23 1826 Given    acetaminophen (TYLENOL) tablet 650 mg  06/13/23 1705 Given    oxyCODONE (ROXICODONE) immediate release tablet 5 mg     06/13/23 1224 Given    acetaminophen (TYLENOL) tablet 650 mg    06/13/23 1224 Given    oxyCODONE (ROXICODONE) immediate release tablet 5 mg          Social: no family at bedside this am   PT Recommendations:      Plan:     C3 Fracture, c/f C5-C6 Extension Injury   -Neuro intact  -CTAE negative for BCVI  -Ortho spine consult  -MRI C-spine - No ligamentous injury appreciated. Redemonstration of C3 osteophyte fx w small anterior hematoma  -Ok to be up ad lib; Aspen at all times   -Will see back in Spine clinic w Josh Long in 1wk     Left distal radius                 -ortho consulted                 -splinted, CCWB, 1 week follow up     TSAH   - GCS 15, not on anticoagulation/antiplatelet medications                 - NSGY consulted                               - Repeat CT Brain - stable, DVT ppx may start 6/19 @ 0700 if patient remains inpatient                               - Keppra x7d, ends 6/24      - F/u in 4 weeks     Tachycardia, paroxysmal                 - troponin negative at outside facility - repeat negative                 - electrolytes WNL                 - EKG NSR on repeat                 - ECHO ordered: Conclusions: There is hyperdynamic left venticular function. No Aortic valve stenosis.                - not not occur OVN on 6/17, continue telemetry/SD    Sinusitis  -States she just finished z-pack prior to admission w/o improvement in symptoms  -States continued congestion/unrelieved symptoms causing dysphonia   -will add PRN sudafed and guaifenesin, will order Augmentin for now and obtain viral swab, if positive for virus will d/c abx     -Will transfer to SD  -Will order regular diet, d/c MIVF  -Will verify/restart home meds as appropriate  -Will consult geriatrics  -Will plan for PT/OT eval today     Dispo: Pending diet tolerance, geri recs, PT/OT recs- possible d/c tomorrow     Lauralyn Primes, APRN,NP-C 06/14/2023, 12:50    I personally saw and evaluated the patient. See mid-level's note for  additional details. My findings/participation are     C3 fx, negative for BCVI, non-operative management, Aspen in place.   tSAH. Repeat CT head reviewed, stable.   Continue Keppra  Resolved episode of tachycardia.   Transfer to stepdown.   Continue multimodal pain control for acute  pain due to acute trauma.  PT/OT  Start DVT chemo ppx tomorrow  Advance diet.     Denies any chest pain, dyspnea, N/V, F/C.     Abdomen soft.     [name], DO

## 2023-06-14 NOTE — Consults (Signed)
[facility]  GERIATRIC CONSULT    Haley Mccall, Haley Mccall, 72 y.o. female  Date of Birth:  Aug 17, 1951  Date of Admission:  06/13/2023  Date of service: 06/14/2023    Service: Trauma   Requesting MD: Dr. Zollie Beckers    Reason for consultation: Comprehensive geriatric assessment.    HPI:  72 y/o  female with a PMH of DM, peripheral neuropathy, GERD, HTN and dyslipidemia, who was admitted on 06/13/23 s/p fall. The patient reported walking to the bathroom early in the morning and tripping over a suitcase lading in the jacuzzi bathtub. Upon arrival to [facility], she was diagnosed with brain SAH, C3, C5-C6 fracture, nondisplaced left distal radius fracture and SVT. She denied CP, SOB, headaches, blurred vision, visual hallucinations, dysuria or chill before/after the fall.    The patient has a good recollection of the events and lies down in bed comfortable. Pain is well controlled with current medications. Independent for ADLs and IADLs. Medication list was reviewed. She mentioned taking elavil for neuropathy.     Past Medical History:   Diagnosis Date    DM (diabetes mellitus) (CMS HCC)     HTN (hypertension)          Allergies   Allergen Reactions    Penicillins Rash    Bactrim [Sulfamethoxazole-Trimethoprim]     Compazine [Prochlorperazine]          Social History  Patient lives with her husband    Medications Prior to Admission       Prescriptions    aMILoride-hydroCHLOROthiazide (MODURETIC) 5-50 mg Oral Tablet    Take 1 Tablet by mouth Once a day    amitriptyline (ELAVIL) 10 mg Oral Tablet    Take 1 Tablet (10 mg total) by mouth Every night    amLODIPine (NORVASC) 2.5 mg Oral Tablet    Take 1 Tablet (2.5 mg total) by mouth Once a day    cholecalciferol, vitamin D3, 50 mcg (2,000 unit) Oral Tablet    Take 1 Tablet (2,000 Units total) by mouth Once a day    cyanocobalamin (VITAMIN B 12) 1,000 mcg Oral Tablet    Take 1 Tablet (1,000 mcg total) by mouth Once a day    dulaglutide (TRULICITY) 1.5 mg/0.5 mL Subcutaneous Pen Injector     Inject 0.5 mL (1.5 mg total) under the skin Every 7 days    empagliflozin/metformin HCl (EMPAGLIFLOZIN-METFORMIN ORAL)    Take 12.5 mg by mouth Once a day    gabapentin (NEURONTIN) 100 mg Oral Capsule    Take 1 Capsule (100 mg total) by mouth Twice daily    losartan (COZAAR) 100 mg Oral Tablet    Take 1 Tablet (100 mg total) by mouth Once a day    magnesium chloride (SLOW-MAG) 64 mg Oral Tablet, Delayed Release (E.C.)    Take 1 Tablet (64 mg total) by mouth Once a day    omeprazole (PRILOSEC) 20 mg Oral Capsule, Delayed Release(E.C.)    Take 1 Capsule (20 mg total) by mouth Once a day    PARoxetine (PAXIL) 30 mg Oral Tablet    Take 1 Tablet (30 mg total) by mouth Every morning    rosuvastatin (CRESTOR) 10 mg Oral Tablet    Take 1 Tablet (10 mg total) by mouth Every evening          acetaminophen (TYLENOL) tablet, 650 mg, Oral, Q6H  albuterol (PROVENTIL) 2.5 mg / 3 mL (0.083%) neb solution, 2.5 mg, Nebulization, Q4H PRN  amitriptyline (ELAVIL) tablet, 10  mg, Oral, NIGHTLY  amLODIPine (NORVASC) tablet, 2.5 mg, Oral, Daily  amoxicillin-clavulanate (AUGMENTIN) 875-125mg  per tablet, 1 Tablet, Oral, 2x/day  cholecalciferol (VITAMIN D3) 1000 unit (25 mcg) tablet, 1,000 Units, Oral, Daily  cyanocobalamin (VITAMIN B12) tablet, 1,000 mcg, Oral, Daily  D5W 250 mL flush bag, , Intravenous, Q15 Min PRN  gabapentin (NEURONTIN) capsule, 200 mg, Oral, 2x/day  guaiFENesin 100mg  per 5mL oral liquid - for cough (expectorant), 200 mg, Oral, Q4H PRN  hydrALAZINE (APRESOLINE) injection 10 mg, 10 mg, Intravenous, Q15 Min PRN  levETIRAcetam (KEPPRA) tablet, 500 mg, Oral, 2x/day  lidocaine-menthol (LIDOPATCH) 3.6%-1.25% patch, 1 Patch, Transdermal, Daily  losartan (COZAAR) tablet, 100 mg, Oral, Daily  methocarbamol (ROBAXIN) tablet, 500 mg, Oral, 4x/day  NS 250 mL flush bag, , Intravenous, Q15 Min PRN  NS flush syringe, 2-6 mL, Intracatheter, Q8HRS  NS flush syringe, 2-6 mL, Intracatheter, Q1 MIN PRN  oxyCODONE (ROXICODONE) immediate  release tablet, 5 mg, Oral, Q4H PRN   Or  oxyCODONE (ROXICODONE) immediate release tablet, 10 mg, Oral, Q4H PRN  pantoprazole (PROTONIX) delayed release tablet, 40 mg, Oral, NIGHTLY  PARoxetine (PAXIL) tablet 30 mg, 30 mg, Oral, QPM  polyethylene glycol (MIRALAX) oral packet, 17 g, Oral, 2x/day  pseudoephedrine (SUDAFED) tablet, 30 mg, Oral, Q6H PRN  rosuvastatin (CRESTOR) tablet, 10 mg, Oral, QPM  sennosides-docusate sodium (SENOKOT-S) 8.6-50mg  per tablet, 1 Tablet, Oral, 2x/day  SSIP insulin R human (HumuLIN R) 100 units/mL injection, 0-12 Units, Subcutaneous, Q6H PRN  triamterene-hydroCHLOROthiazide (MAXZIDE) 75-50mg  per tablet, 1 Tablet, Oral, Daily        ROS: All of them negative except the ones mentioned in the HPI    EXAM:  Temperature: 36.7 C (98.1 F)  Heart Rate: 83  Respiratory Rate: 15  BP (Non-Invasive): (!) 117/53  Height: 165.1 cm (5\' 5" )  Weight: 71.5 kg (157 lb 10.1 oz)  SpO2: 90 %  BMI (Calculated): 26.29    General appearance: alert, cooperative, no distress  Head: Normocephalic, without obvious abnormality, atraumatic  Eyes: conjunctivae/corneas clear. PERRL  Ears: normal TM's and external ear canals AU  Nose: Nares normal. Septum midline. Mucosa normal.  Neck: (+) cervical collar  Lungs: clear to auscultation bilaterally  Heart: regular rate and rhythm, S1, S2 normal  Abdomen: soft, non-tender. Bowel sounds normal. No masses,  no organomegaly  Extremities: Left arm wrap up to the elbow. The rest of the  extremities normal, no cyanosis or edema  Neurology: There is no motor/sensory deficits.  Lymph nodes: Cervical, supraclavicular nodes normal    COMPREHENSIVE GERIATRIC ASSESSMENT  Functional status:  A.  Basic activities of daily living  Bathing: Yes,   Dressing: Yes  Toileting: Yes  Maintaining Continence: Yes  Grooming: Yes  Feeding: Yes  Transferring: Yes    B.  Instrumental activities of daily living  Shopping for groceries:  Yes  Driving or suing public transportation:  Yes  Using  telephone:  Yes  Performing house work:  Yes  Doing home repairs:  No  Preparing meals:  Yes  Doing laundry:  Yes  Taking medications:  Yes  Handling finances:  Yes    C.  Advance activities of daily living:  Social participation:  Yes  Community participation:  Yes  Family roles:  Yes  Recreational activities:  Yes    Pain:  Yes (under control)    Gait:  Stable    Fall/balance:  Yes    Cognition:  No problems reported    Mood disorders:  Yes (depression/anxiety)  Polypharmacy:  Yes    Nutrition/weight change:  No    Urinary incontinence:  No    Sexual function:  No assessed    Social and financial support:  Yes    Goal of care/advance care preferences:  Full Code      Studies:              Assessment and Plan: 72 y/o  female with a PMH of DM, peripheral neuropathy, GERD, HTN and dyslipidemia, who was admitted on 06/13/23 s/p fall with multiple geriatric fractures. Geriatrics was consulted for a comprehensive geriatric assessment.     1) Geriatric fall: Multifactorial including mechanical, polypharmacy, cardiac arrhyhtmia  -Check Vitamin D  -PT/OT  -Dexa scan as outpatient  -Check  Hba1c         -Agree with ISS  -BP low normal         -Continue triamtirene/ HCTZ and losarta         -Hold amlodipine  -Polypharmacy          -Taper elavil 5 mg PO daily for 10 days then, D/C  -Anemia and anticoagulation per primary team  -SVT  (sinus rhythm currently)          -Agree ECHO (trace mitral/tricuspid regurgitations)          -Troponin (-) x 2          -Continue Telemetry          -Check orthostatic BP  -Pain           -Continue scheduled tylenol and oxy 5mg  PO Q 4 hours PRN           -Continue gabapentin 100 mg Po daily           -Discontinue Robaxin and oxy 10 mg  -Continue Paxil for depression/anxiety    2) Low risk for delirium  Communicate clearly and address sensory impairment  Encourage mobility and self-care  Optimise nutrition, hydration and regular continence  Minimise risk of injury and agitation  Minimise use of  antipsychotic medications  Monitor and respond to pain  Use night-time strategies to promote sleep such as melatonin    We will follow up tomorrow. Thank you for the consult. Please contact us if you may have further questions.     Total time: 60 minutes    Alford Highland, MD

## 2023-06-14 NOTE — Care Plan (Signed)
[facility]  Rehabilitation Services  Physical Therapy Initial Evaluation    Patient Name: Haley Mccall  Date of Birth: [birth year]  Height: Height: 165.1 cm (5\' 5" )  Weight: Weight: 71.5 kg (157 lb 10.1 oz)  Room/Bed: 01/A  Payor: MEDICARE / Plan: MEDICARE PART A AND B / Product Type: Medicare /     Assessment:      Pt was able to perform bed mobility, transfer training, and gait training with SBA x2, but assist level of Min assist for a loss of balance episode and toliet transfer. Pt seem to be stable throughout session and able to tolerate eval session without a significant drop in SPO2. Pt is motivated and doesnt have many concerns for at home functional mobility needs. PT to see patient throughout hospital stay to further increase functional status to independent baseline. PT recommends home with assistance granted that pt passes stair navigation training with PT. AD to be reccomended is still to be determinded considering functional level and the course of her UE WB status. PT will further evaluate furture rehab and AD needs with future sessions.    Discharge Needs:    Equipment Recommendation: TBD    Discharge Disposition: home with assist, TBD (following successful stair training)    JUSTIFICATION OF DISCHARGE RECOMMENDATION   Based on current diagnosis, functional performance prior to admission, and current functional performance, this patient requires continued PT services in home with assist, TBD (following successful stair training) in order to achieve significant functional improvements in these deficit areas: aerobic capacity/endurance, gait, locomotion, and balance, muscle performance.        Plan:   Current Intervention: balance training, bed mobility training, gait training, strengthening, stair training, ROM (range of motion), patient/family education, motor coordination training  To provide physical therapy services 1x/day, minimum of 3x/week  for duration of until discharge.    The  risks/benefits of therapy have been discussed with the patient/caregiver and he/she is in agreement with the established plan of care.       Subjective & Objective        06/14/23 1202   Therapist Pager   PT Assigned/ Pager # Clayburn Pert [phone] / Toniann Fail [phone]   Rehab Session   Document Type evaluation   PT Visit Date 06/14/23   Total PT Minutes: 26   Patient Effort good   Symptoms Noted During/After Treatment fatigue   General Information   Patient Profile Reviewed yes   Onset of Illness/Injury or Date of Surgery 06/13/23   Pertinent History of Current Functional Problem This is a 72 y.o. female with PMH of DM, peripheral neuropathy, GERD, HTN HLD presenting after ground level fall. She woke up this morning and was walking to the bathroom when she tripped over luggage and fell into the [place] bathtub in her bathroom.  Found to have R sylvian fissure tSAH, C3 fracture, C5-C6 extension injury, nondisplaced L distal radius fracture. Had several runs of paroxysmal SVT and tachycardia in ED and CT. Admit to SICU.   Medical Lines PIV Line;Telemetry   Respiratory Status face mask;room air   Existing Precautions/Restrictions full code;c-collar;fall precautions;weight bearing restriction   General Observations of Patient Pt was seen on SICU for PT eval, she was alert and agreeable.   Weight-bearing Status   Extremity Precautions left upper extremity   Left Upper Extremity toe touch weight-bearing (TTWB)   Mutuality/Individual Preferences   Individualized Care Needs OOB x1 SBA + 1 for line managment   Patient-Specific Goals (Include Timeframe) to  go home   Plan of Care Reviewed With patient   Living Environment   Lives With spouse   Living Arrangements house   Home Assessment: Stairs in Home   Home Accessibility bed and bath are not on the first floor;stairs within home;stairs (1 railing present);stairs to enter home   Living Environment Comment Pt lives in a multifloor home with 14 steps to enter from garage and 14 steps to the bed  and bath.   Stairs Within Home, Primary   Stairs, Within Home, Primary 14 to bed and bath   Landing, Stairs, Within Home, Primary railings present;adequate turning radius;other (see comments)  (There is an opening where there are no railing)   Stair Railings, Within Home, Primary railing on left side (ascending)   Number of Stairs, Within Home, Primary other (see comments)  (14)   Home Main Entrance   Number of Stairs, Main Entrance other (see comments)  (14)   Stair Railings, Main Entrance railing on left side (ascending)   Functional Level Prior   Ambulation 0 - independent   Transferring 0 - independent   Toileting 0 - independent   Bathing 0 - independent   Dressing 0 - independent   Eating 0 - independent   Communication 0 - understands/communicates without difficulty   Swallowing 0-->swallows foods/liquids without difficulty   Prior Functional Level Comment Pt states she was independent and active   Self-Care   Usual Activity Tolerance excellent   Current Activity Tolerance good   Equipment Currently Used at Home no   Activity/Exercise/Self-Care Comment has crutches at home   Pre Treatment Status   Pre Treatment Patient Status Patient sitting in bedside chair or w/c;Call light within reach;Telephone within reach;Patient safety alarm activated;Nurse approved session   Support Present Pre Treatment  None   Communication Pre Treatment  Nurse   Communication Pre Treatment Comment Nurse approved session   Cognitive Assessment/Interventions   Behavior/Mood Observations cooperative;alert;behavior appropriate to situation, WNL/WFL   Attention WNL/WFL   Follows Commands WNL   Vital Signs   Pre-Treatment Heart Rate (beats/min) 82   Post-treatment Heart Rate (beats/min) 86   Pre-Treatment Resp Rate (breaths/min) 12   Post-treatment Resp Rate (breaths/min) 25   Pre Treatment BP 117/53   Post Treatment BP 145/62   Pre SpO2 (%) 95   O2 Delivery Pre Treatment supplemental O2   Post SpO2 (%) 97   O2 Delivery Post Treatment  room air   Vitals Comment Pt did not have O2 mask on for eval with no drops in O2% bleow 94%. Mask was placed back on pt at the end of treatment. She reported no symptoms throughout.   Pain Assessment   Pretreatment Pain Rating   (slight pain)   Posttreatment Pain Rating   (pain is the same)   RLE Assessment   RLE Assessment WFL- Within Functional Limits   RLE ROM WFL   LLE Assessment   LLE Assessment WFL- Within Functional Limits   LLE ROM WFL   Trunk Assessment   Trunk Assessment WFL-Within Functional Limits   Bed Mobility Assessment/Treatment   Bed Mobility, Assistive Device Head of Bed Elevated   Sit to Supine, Independence stand-by assistance   Safety Issues decreased use of arms for pushing/pulling   Impairments pain;endurance;strength decreased   Transfer Assessment/Treatment   Sit-Stand Independence contact guard assist   Stand-Sit Independence contact guard assist   Sit-Stand-Sit, Assist Device side by side   Bed-Chair-Bed Assist Device none   Toilet Transfer Independence minimum  assist (75% patient effort)   Toilet Transfer Assist Device handheld assist   Transfer Safety Issues balance decreased during turns;sequencing ability decreased;step length decreased;weight-shifting ability decreased   Transfer Impairments balance impaired;coordination impaired;endurance;pain;postural control impaired;strength decreased   Transfer Comment postural sway with toliet transfer, WB status was followed.   Gait Assessment/Treatment   Total Distance Ambulated 60   Independence  contact guard assist   Assistive Device  hand-held assistance   Distance in Feet 60   Gait Speed Slow but functional   Deviations  cadence decreased;double stance time increased;limb motion velocity decreased;step length decreased;stride length decreased;weight-shifting ability decreased   Maintain Weight Bearing Status able to maintain   Safety Issues  balance decreased during turns;sequencing ability decreased;step length decreased;weight-shifting  ability decreased   Impairments  balance impaired;coordination impaired;endurance;pain;postural control impaired;strength decreased   Comment Pt had increased postural sway durning turns, 1 LOB episode when starting to initiate gait again after a standing break. Pt regained balance with the help of min assist x2.   Balance   Sitting Balance: Static good balance   Sitting, Dynamic (Balance) good balance   Sit-to-Stand Balance good balance   Standing Balance: Static good balance   Standing Balance: Dynamic fair + balance   Systems Impairment Contributing to Balance Disturbance musculoskeletal   Identified Impairments Contributing to Balance Disturbance impaired coordination;pain;impaired postural control;decreased strength   Therapeutic Exercise/Activity   Comment Pt was able to stand by sink with forward and lateral leaning with SBA for balance to perform ADLs.   Post Treatment Status   Post Treatment Patient Status Patient supine in bed;Call light within reach;Telephone within reach;Patient safety alarm activated;Venodynes in place and activated   Support Present Post Treatment  None   Communication Post Treatement Nurse   Communication Post Treatment Comment Pt status and performance   Plan of Care Review   Plan Of Care Reviewed With patient   Basic Mobility Am-PAC/6Clicks Score (APPROVED Staff)   Turning in bed without bedrails 4   Lying on back to sitting on edge of flat bed 4   Moving to and from a bed to a chair 3   Standing up from chair 3   Walk in room 3   Climbing 3-5 steps with railing 2   6 Clicks Raw Score total 19   Standardized (t-scale) score 42.48   Patient Mobility Goal (JHHLM) 6- Walk 10 steps or more 2X/day   Exercise/Activity Level Performed 7- Walked 25 feet or more   Functional Impairment   Overall Functional Impairments/Problem List balance impaired;coordination impaired;endurance;pain;postural control impaired;strength decreased   Physical Therapy Clinical Impression   Assessment Pt was able  to perform bed mobility, transfer training, and gait training with SBA x2, but assist level of Min assist for a loss of balance episode and toliet transfer. Pt seem to be stable throughout session and able to tolerate eval session without a significant drop in SPO2. Pt is motivated and doesnt have many concerns for at home functional mobility needs. PT to see patient throughout hospital stay to further increase functional status to independent baseline. PT recommends home with assistance granted that pt passes stair navigation training with PT. AD to be reccomended is still to be determinded considering functional level and the course of her UE WB status. PT will further evaluate furture rehab and AD needs with future sessions.   Patient/Family Goals Statement to be discharged and get rid of sinus infection   Criteria for Skilled Therapeutic meets criteria   Pathology/Pathophysiology Noted  musculoskeletal   Impairments Found (describe specific impairments) aerobic capacity/endurance;gait, locomotion, and balance;muscle performance   Functional Limitations in Following  home management;community/leisure   Disability: Inability to Perform community/leisure   Rehab Potential good   Therapy Frequency 1x/day;minimum of 3x/week   Predicted Duration of Therapy Intervention (days/wks) until discharge   Anticipated Equipment Needs at Discharge (PT) TBD   Anticipated Discharge Disposition home with assist;TBD  (following successful stair training)   Evaluation Complexity Justification   Patient History: Co-morbidity/factors that impact Plan of Care 3 or more that impact Plan of Care   Examination Components 4 or more Exam elements addressed   Presentation Evolving: Symptoms, complaints, characteristics of condition changing &/or cognitive deficits present   Clinical Decision Making Moderate complexity   Evaluation Complexity Moderate complexity   Care Plan Goals   PT Rehab Goals Physical Therapy Goal;Bed Mobility Goal;Gait  Training Goal;Stairs Training Goal   Physical Therapy Goal   PT  Goal, Date Established 06/14/23   PT Goal, Time to Achieve by discharge   PT Goal, Activity Type Pt to maintain WB status with gait, transfers, ADLs, and bed mobility.   PT Goal, Current Status verbal cues required   PT Goal, Independence Level independent   Bed Mobility Goal   Bed Mobility Goal, Date Established 06/14/23   Bed Mobility Goal, Time to Achieve by discharge   Bed Mobility Goal, Activity Type all bed mobility activities   Bed Mobility Goal, Independence Level independent   Bed Mobility Goal, Assistive Device least restrictive assistive device   Gait Training  Goal, Distance to Achieve   Gait Training  Goal, Date Established 06/14/23   Gait Training  Goal, Time to Achieve by discharge   Gait Training  Goal, Independence Level supervision required   Gait Training  Goal, Assist Device least restricted assistive device   Gait Training  Goal, Distance to Achieve 125   Stairs Training Goal   Stairs Training Goal, Date Established 06/14/23   Stairs Training Goal, Time to Achieve by discharge   Stairs Training Goal, Independence Level stand-by assistance   Stairs Training Goal, Assist Device least restrictive assistive device   Stairs Training Goal, Number of Stairs to Achieve 14   [place] Patient Lifting and Movement Equipment    Device to assist movement in bed (see row detail for guidelines): None   Planned Therapy Interventions, PT Eval   Planned Therapy Interventions (PT) balance training;bed mobility training;gait training;strengthening;stair training;ROM (range of motion);patient/family education;motor coordination training   Psychosocial Support   Trust Relationship/Rapport care explained;questions encouraged;questions answered       Therapist:   Margarito Courser, PHYSICAL THERAPY STUDENT   Pager #: [phone]    I was physically present and immediately available during the student's provision of patient care. I have reviewed the  student's note and agree with the findings and plan of care as documented. Any exceptions or additions are edited/noted.    Camelia Eng Nicola Police, PT  06/14/2023 17:09  Pager Number: [phone]

## 2023-06-14 NOTE — Nurses Notes (Signed)
8NE room 17 marked clean. Pt and belongings transported via wheelchair with RN escort

## 2023-06-14 NOTE — Consults (Signed)
[facility]  Neurosurgery Consult  Follow Up Note    Haley, Mccall, 72 y.o. female  Date of Service: 06/14/2023  Date of Birth:  [birth year]    Hospital Day:  LOS: 1 day     Chief Complaint:  GLF  Subjective: NAEO.  Repeat CT brain stable    Objective:  Temperature: 36.9 C (98.4 F)  Heart Rate: 91  BP (Non-Invasive): (!) 125/54  Respiratory Rate: 17  SpO2: 94 %    General: Appears stated age, NAD  Cardio: Radial pulses intact bilaterally  ENT: Trachea midline  Respiratory: Regular respirations  Skin: Skin pink    Neurologic Exam:  A&Ox3  GCS 4 6 5   Fluent speech  Appropriate fund of knowledge  Appropriate attention span & concentration  Appropriate recent and remote memory  CN 2 PERRL  CN 3 4 6  EOMI  CN 7 Face symmetric  CN 8 Hearing grossly intact  CN 11 shrug symmetric  CN 12 Tongue midline  Muscle Strength 5/5 BUE and BLE except pain limited LUE  Muscle tone WNL  SILT BUE and BLE    Assessment/Recommendations:  Haley Mccall is a 72 y.o., female who presents after fall found to have small volume tSAH within R sylvian fissure     -- No acute neurosurgical intervention  -- Repeat CT brain wo was stable  -- SBP goals <160 mmHg  -- Na goals eunatremia  -- Keppra 500 mg 2x/day x 7 days, end-date 6/23  -- No anti-coagulant or anti-platelet medications   - Ok for DVT chemo ppx 48 hours after stable imaging   - Would not recommend repeat CT brain wo after starting DVT chemo ppx unless clinical concern/change in neurologic status  -- No activity restrictions from a neurosurgery standpoint   -- Patient may follow up with Neurosurgery in 4 weeks, no imaging (ordered)  -- Neurosurgery service will sign off at this time     -- Imaging               -- CT-brain wo (06/13/23): stable small tSAH in R sylvian fissure               -- CT-brain wo (06/13/23): Small volume R sylvian fissure tSAH     Please call with any questions or concerns.    Leonor Liv, PA-C 06/14/2023 07:05  Department of Neurosurgery - Neurotrauma    I  independently of the faculty provider spent a total of (37) minutes in direct/indirect care of this patient including initial evaluation, review of laboratory, radiology, diagnostic studies, review of medical record, order entry and coordination of care.

## 2023-06-14 NOTE — Nurses Notes (Signed)
Patient transferred to [facility] from ICU via wheelchair and RN escort. VSS. OOB Ax1. Bed alarm in use. No other needs or concerns voiced at this time.  Rafael Bihari, RN

## 2023-06-15 ENCOUNTER — Encounter (HOSPITAL_COMMUNITY): Payer: Self-pay | Admitting: SURGICAL CRITICAL CARE

## 2023-06-15 ENCOUNTER — Other Ambulatory Visit: Payer: Self-pay

## 2023-06-15 ENCOUNTER — Inpatient Hospital Stay (HOSPITAL_COMMUNITY): Payer: Medicare Other

## 2023-06-15 DIAGNOSIS — Z79899 Other long term (current) drug therapy: Secondary | ICD-10-CM

## 2023-06-15 DIAGNOSIS — R490 Dysphonia: Secondary | ICD-10-CM

## 2023-06-15 DIAGNOSIS — Z981 Arthrodesis status: Secondary | ICD-10-CM

## 2023-06-15 DIAGNOSIS — Z978 Presence of other specified devices: Secondary | ICD-10-CM

## 2023-06-15 DIAGNOSIS — D649 Anemia, unspecified: Secondary | ICD-10-CM

## 2023-06-15 DIAGNOSIS — R0981 Nasal congestion: Secondary | ICD-10-CM

## 2023-06-15 DIAGNOSIS — R0689 Other abnormalities of breathing: Secondary | ICD-10-CM

## 2023-06-15 DIAGNOSIS — M810 Age-related osteoporosis without current pathological fracture: Secondary | ICD-10-CM

## 2023-06-15 DIAGNOSIS — E119 Type 2 diabetes mellitus without complications: Secondary | ICD-10-CM

## 2023-06-15 LAB — VITAMIN D 25 TOTAL: VITAMIN D 25, TOTAL: 51.2 ng/mL (ref 30.0–100.0)

## 2023-06-15 LAB — POC BLOOD GLUCOSE (RESULTS)
GLUCOSE, POC: 100 mg/dl (ref 70–105)
GLUCOSE, POC: 126 mg/dl — ABNORMAL HIGH (ref 70–105)
GLUCOSE, POC: 106 mg/dl — ABNORMAL HIGH (ref 70–105)
GLUCOSE, POC: 88 mg/dl (ref 70–105)

## 2023-06-15 LAB — HGA1C (HEMOGLOBIN A1C WITH EST AVG GLUCOSE)
ESTIMATED AVERAGE GLUCOSE: 151 mg/dL
HEMOGLOBIN A1C: 6.9 % — ABNORMAL HIGH (ref 4.0–5.6)

## 2023-06-15 MED ORDER — ACETAMINOPHEN 325 MG TABLET
650.0000 mg | ORAL_TABLET | Freq: Four times a day (QID) | ORAL | Status: AC
Start: 2023-06-15 — End: 2023-06-29

## 2023-06-15 MED ORDER — MAGNESIUM HYDROXIDE 400 MG/5 ML ORAL SUSPENSION
30.0000 mL | Freq: Two times a day (BID) | ORAL | Status: DC
Start: 2023-06-15 — End: 2023-06-16
  Administered 2023-06-15 – 2023-06-16 (×3): 2400 mg via ORAL
  Filled 2023-06-15 (×3): qty 30

## 2023-06-15 MED ORDER — GUAIFENESIN 100 MG/5 ML ORAL LIQUID
200.0000 mg | ORAL | Status: AC | PRN
Start: 2023-06-15 — End: ?

## 2023-06-15 MED ORDER — ENOXAPARIN 30 MG/0.3 ML SUBCUTANEOUS SYRINGE
30.0000 mg | INJECTION | Freq: Two times a day (BID) | SUBCUTANEOUS | Status: DC
Start: 2023-06-15 — End: 2023-06-16
  Administered 2023-06-15 – 2023-06-16 (×2): 30 mg via SUBCUTANEOUS
  Filled 2023-06-15 (×2): qty 0.3

## 2023-06-15 MED ORDER — SENNOSIDES 8.6 MG-DOCUSATE SODIUM 50 MG TABLET
1.0000 | ORAL_TABLET | Freq: Two times a day (BID) | ORAL | 0 refills | Status: AC
Start: 2023-06-15 — End: 2023-06-23
  Filled 2023-06-15: qty 14, 7d supply, fill #0

## 2023-06-15 MED ORDER — NALOXONE 4 MG/ACTUATION NASAL SPRAY
1.0000 | NASAL | 0 refills | Status: AC | PRN
Start: 2023-06-15 — End: ?
  Filled 2023-06-15: qty 2, 1d supply, fill #0

## 2023-06-15 MED ORDER — BENZOCAINE-MENTHOL SORE THROAT LOZENGE WRAPPER
1.0000 | LOZENGE | Status: DC | PRN
Start: 2023-06-15 — End: 2023-06-16

## 2023-06-15 MED ORDER — BENZOCAINE-MENTHOL SORE THROAT LOZENGE WRAPPER
1.0000 | LOZENGE | Status: AC | PRN
Start: 2023-06-15 — End: ?

## 2023-06-15 MED ORDER — PSEUDOEPHEDRINE 30 MG TABLET
30.0000 mg | ORAL_TABLET | Freq: Four times a day (QID) | ORAL | 0 refills | Status: AC | PRN
Start: 2023-06-15 — End: 2023-06-23
  Filled 2023-06-15: qty 28, 7d supply, fill #0

## 2023-06-15 MED ORDER — LEVETIRACETAM 500 MG TABLET
500.0000 mg | ORAL_TABLET | Freq: Two times a day (BID) | ORAL | 0 refills | Status: AC
Start: 2023-06-15 — End: 2023-06-21
  Filled 2023-06-15: qty 9, 5d supply, fill #0

## 2023-06-15 MED ORDER — METHOCARBAMOL 500 MG TABLET
1000.0000 mg | ORAL_TABLET | Freq: Four times a day (QID) | ORAL | 0 refills | Status: AC
Start: 2023-06-15 — End: 2023-06-30
  Filled 2023-06-15: qty 112, 14d supply, fill #0

## 2023-06-15 MED ORDER — OXYCODONE 5 MG TABLET
5.0000 mg | ORAL_TABLET | ORAL | 0 refills | Status: AC | PRN
Start: 2023-06-15 — End: 2023-06-23
  Filled 2023-06-15: qty 42, 7d supply, fill #0

## 2023-06-15 MED ORDER — MAGNESIUM SULFATE 4 GRAM/100 ML (4 %) IN WATER INTRAVENOUS PIGGYBACK
4.0000 g | INJECTION | Freq: Once | INTRAVENOUS | Status: AC
Start: 2023-06-15 — End: 2023-06-15
  Administered 2023-06-15: 4 g via INTRAVENOUS
  Administered 2023-06-15: 0 g via INTRAVENOUS
  Filled 2023-06-15: qty 100

## 2023-06-15 NOTE — Care Plan (Signed)
[facility]  Rehabilitation Services  Physical Therapy Initial Evaluation    Patient Name: Haley Mccall  Date of Birth: 04/19/1951  Height: Height: 165.1 cm (5\' 5" )  Weight: Weight: 71.5 kg (157 lb 10.1 oz)  Room/Bed: 17/A  Payor: MEDICARE / Plan: MEDICARE PART A AND B / Product Type: Medicare /     Assessment:      Mrs. Doughty was alert and agreeable to PT tx. She was assessed for orthostatic BP and had a positive drop in SBP with standing. She was initially asymptomatic but only tolerated a few ft of amb before becoming symptomatic. She recovered well with brief rest and was able to amb to/from toilet without return of symptoms. Further mobility deferred today. PT to still recommend home with assist if medically stable and with the completion of stair navigation training with PT. PT to further evaluate for AD use.    Discharge Needs:    Equipment Recommendation: TBD    Discharge Disposition: home with assist, TBD  Plan:   Current Intervention: balance training, bed mobility training, gait training, strengthening, stair training, ROM (range of motion), patient/family education, motor coordination training  To provide physical therapy services 1x/day, minimum of 3x/week  for duration of until discharge.    The risks/benefits of therapy have been discussed with the patient/caregiver and he/she is in agreement with the established plan of care.       Subjective & Objective        06/15/23 1302   Therapist Pager   PT Assigned/ Pager # Clayburn Pert [phone] / Toniann Fail [phone]   Rehab Session   Document Type therapy progress note (daily note)   PT Visit Date 06/15/23   Total PT Minutes: 28   Patient Effort good   Symptoms Noted During/After Treatment dizziness;significant change in vital signs   Symptoms Noted Comment Pt was asymptomatic for the first half and the end of therapy. When symptomatic stated lightheaded and woosey.   General Information   Patient Profile Reviewed yes   Onset of Illness/Injury or Date of Surgery  06/13/23   Medical Lines PIV Line;Telemetry   Respiratory Status room air;nasal cannula  (2 LPM for sleep; removed for activity per nurse and left off at end of tx with pt awake in chair)   Existing Precautions/Restrictions full code;fall precautions;droplet isolation;weight bearing restriction   General Observations of Patient Pt was seen on 8NE, was alert and agreeable to therapy.   Weight-bearing Status   Extremity Precautions left upper extremity   Left Upper Extremity toe touch weight-bearing (TTWB)   Mutuality/Individual Preferences   Individualized Care Needs OOB with CGA x2 for ortho static hypotension   Patient-Specific Goals (Include Timeframe) To go home   Plan of Care Reviewed With patient   Pre Treatment Status   Pre Treatment Patient Status Patient supine in bed;Call light within reach;Telephone within reach;Nurse approved session;Patient safety alarm activated   Support Present Pre Treatment  None   Communication Pre Treatment  Nurse   Communication Pre Treatment Comment RN approved session; needs orthostatics   Cognitive Assessment/Interventions   Behavior/Mood Observations behavior appropriate to situation, WNL/WFL;cooperative;alert   Attention WNL/WFL   Follows Commands WNL   Vital Signs   Pre-Treatment Heart Rate (beats/min) 83  (Supine position 83, seated position 91, standing position 97, reclined after walking 86, reclined after toliet transfer 85)   Post-treatment Heart Rate (beats/min) 85  (Supine position 83, seated position 91, standing position 97, reclined after walking 86, reclined after toliet  transfer 85)   Pre-Treatment Resp Rate (breaths/min) 16  (highest of 34)   Post-treatment Resp Rate (breaths/min) 19  (highest of 34)   Pre Treatment BP 123/ 53  (supine BP 123/ 53, seated BP 121/55, standing 99/52, reclined after walking128/53, reclined after toliet transfer 133/55.)   Post Treatment BP 133/55  (supine BP 123/ 53, seated BP 121/55, standing 99/52, reclined after walking128/53,  reclined after toliet transfer 133/55.)   Pre SpO2 (%) 97   O2 Delivery Pre Treatment supplemental O2   Post SpO2 (%) 95   O2 Delivery Post Treatment room air   Vitals Comment removed supplemental O2 for therapy to test SPO2, SPO2 did not drop below 92 throughout movement. Pt had an orthostatic drop in SBP with standing. Initially asymptomatic but did develop lightheadedness after a few ft of amb. BP recovered well once reclined. Pt was then able to amb to/from toilet without further symptoms. BP stable at end of tx.   Pain Assessment   Pre/Posttreatment Pain Comment no c/o pain   Bed Mobility Assessment/Treatment   Bed Mobility, Assistive Device Head of Bed Elevated   Supine-Sit Independence stand-by assistance   Sit to Supine, Independence contact guard assist   Safety Issues decreased use of arms for pushing/pulling   Impairments coordination impaired;endurance;pain   Transfer Assessment/Treatment   Sit-Stand Independence contact guard assist   Stand-Sit Independence contact guard assist   Sit-Stand-Sit, Assist Device side by side   Bed-Chair Independence contact guard assist   Toilet Transfer Independence contact guard assist   Toilet Transfer Assist Device handheld assist   Transfer Safety Issues balance decreased during turns;sequencing ability decreased;step length decreased;other (see comments)  (Orthostatic Hypotension)   Transfer Impairments balance impaired;coordination impaired;endurance;pain;strength decreased   Gait Assessment/Treatment   Total Distance Ambulated 6   Independence  contact guard assist;moderate assist (50% patient effort)   Assistive Device  hand-held assistance   Distance in Feet 14   Gait Speed slow but functional   Deviations  cadence decreased;double stance time increased;step length decreased   Maintain Weight Bearing Status able to maintain   Safety Issues  balance decreased during turns;step length decreased  (Orthostatic hypotension)   Impairments  balance impaired;coordination  impaired;endurance;pain;strength decreased   Comment Pt was asymptomatic durning session until gait, pt took a few steps and needed to sit down and was reclined by PT to help increase BP. Pt's symptoms then resided quickly once in chair. She was then able to amb to/from toilet without return of symptoms.   Balance   Sitting Balance: Static fair balance   Sitting, Dynamic (Balance) fair - balance   Sit-to-Stand Balance fair balance   Standing Balance: Static fair - balance   Standing Balance: Dynamic poor - balance   Systems Impairment Contributing to Balance Disturbance musculoskeletal   Identified Impairments Contributing to Balance Disturbance impaired coordination;impaired postural control;decreased strength   Post Treatment Status   Post Treatment Patient Status Patient sitting in bedside chair or w/c;Call light within reach;Telephone within reach;Patient safety alarm activated;Venodynes in place and activated   Support Present Post Treatment  None   Communication Post Treatement Nurse   Communication Post Treatment Comment Pt orthostatic tension, pt status and performance   Plan of Care Review   Plan Of Care Reviewed With patient   Basic Mobility Am-PAC/6Clicks Score (APPROVED Staff)   Turning in bed without bedrails 4   Lying on back to sitting on edge of flat bed 4   Moving to and from a bed  to a chair 3   Standing up from chair 3   Walk in room 3   Climbing 3-5 steps with railing 2   6 Clicks Raw Score total 19   Standardized (t-scale) score 42.48   Patient Mobility Goal (JHHLM) 6- Walk 10 steps or more 2X/day   Exercise/Activity Level Performed 4- Transferred to chair/commode   Functional Impairment   Overall Functional Impairments/Problem List balance impaired;coordination impaired;endurance;pain;strength decreased   Physical Therapy Clinical Impression   Assessment Mrs. Rossmiller was alert and agreeable to PT tx. She was assessed for orthostatic BP and had a positive drop in SBP with standing. She was  initially asymptomatic but only tolerated a few ft of amb before becoming symptomatic. She recovered well with brief rest and was able to amb to/from toilet without return of symptoms. Further mobility deferred today. PT to still recommend home with assist if medically stable and with the completion of stair navigation training with PT. PT to further evaluate for AD use.   Anticipated Equipment Needs at Discharge (PT) TBD   Anticipated Discharge Disposition home with assist;TBD   [place] Patient Lifting and Movement Equipment    Device to assist movement in bed (see row detail for guidelines): None       Therapist:   Margarito Courser, PHYSICAL THERAPY STUDENT   Pager #: [phone]    I was physically present and immediately available during the student's provision of patient care. I have reviewed the student's note and agree with the findings and plan of care as documented. Any exceptions or additions are edited/noted.    Camelia Eng Nicola Police, PT  06/15/2023 20:19  Pager Number: [phone]

## 2023-06-15 NOTE — Consults (Signed)
[facility]                                                      Trauma Progress Note                 Date of Birth:  01-Dec-1951  Date of Admission:  06/13/2023  Date of service: 06/15/2023    Haley Mccall, 72 y.o., female Post trauma day 1 status post fall.    Hospital Course:   6/18: P2 Transfer from Outside [facility], s/p mechanical FFS, CT Brain, CT C-spine, CT face, and x-rays at outside facility, found to have R sylvian fissure tSAH, C3 fracture, C5-C6 extension injury, nondisplaced L distal radius fracture. Had several runs of paroxysmal SVT and tachycardia in ED and CT. Admit to SICU.   6/19: Repeat CT brain stable, non-op ortho plans, transfer to SD, reg diet, pain control, geri consult, PT/OT     Events over the last 24 hours have included:  See above    Subjective:    Patient reports nasal congestion/sinus pain and neck pain. States neck pain is well controlled and improved by current pain regimen. States congestion and dysphonia improved with current medications. Denies abd pain or distention. Patient denies CP, SOB, n/v/d, abd pain, paresthesias, or any other new or worsening symptoms. Denies new or additional areas of pain. States husband is a good source of support.      Objective   24 Hour Summary:    Filed Vitals:    06/15/23 1000 06/15/23 1010 06/15/23 1015 06/15/23 1100   BP:   (!) 109/56 (!) 116/51   Pulse: 93 93 89 89   Resp: 14 (!) 21 16 (!) 23   Temp:    36.9 C (98.5 F)   SpO2: 90% 95% 95% 91%     Labs:  Recent Labs     06/13/23  0956 06/13/23  0958 06/13/23  1523 06/14/23  0022   WBC 12.7*  --   --  10.6   HGB 10.6*  --   --  10.2*   HCT 32.7*  --   --  31.6*   SODIUM 135* 133*  --  136   POTASSIUM 3.7  --   --  3.7   CHLORIDE 99 100  --  101   BICARBONATE  --  25.3  --   --    BUN 16  --   --  14   CREATININE 0.79  --   --  0.73   GLUCOSE 131* 136* >=1000* 78   ANIONGAP 12  --   --  14*   CALCIUM 9.8  --   --  8.5*   MAGNESIUM  --   --   --  1.5*   PHOSPHORUS   --   --   --  2.7   INR 1.03  --   --   --      In: 788.75 [P.O.:600; I.V.:188.75]  Out: 375 [Urine:375]  Nutrition Management: MNT PROTOCOL FOR DIETITIAN  DIET REGULAR Date of Last Bowel Movement: 06/12/23  No results for input(s): "ALBUMIN", "PREALBUMIN" in the last 72 hours.  Current Medications:  acetaminophen (TYLENOL) tablet, 650 mg, Oral, Q6H  albuterol (PROVENTIL) 2.5 mg / 3 mL (0.083%) neb solution, 2.5 mg, Nebulization, Q4H PRN  amitriptyline (  ELAVIL) tablet, 10 mg, Oral, NIGHTLY  [Held by provider] amLODIPine (NORVASC) tablet, 2.5 mg, Oral, Daily  benzocaine-menthol (CHLORASEPTIC SORE THROAT) oral lozenge, 1 Lozenge, Oral, Q2H PRN  cholecalciferol (VITAMIN D3) 1000 unit (25 mcg) tablet, 1,000 Units, Oral, Daily  cyanocobalamin (VITAMIN B12) tablet, 1,000 mcg, Oral, Daily  D5W 250 mL flush bag, , Intravenous, Q15 Min PRN  enoxaparin PF (LOVENOX) 30 mg/0.3 mL SubQ injection, 30 mg, Subcutaneous, Q12H  gabapentin (NEURONTIN) capsule, 200 mg, Oral, 2x/day  guaiFENesin 100mg  per 5mL oral liquid - for cough (expectorant), 200 mg, Oral, Q4H PRN  levETIRAcetam (KEPPRA) tablet, 500 mg, Oral, 2x/day  lidocaine-menthol (LIDOPATCH) 3.6%-1.25% patch, 1 Patch, Transdermal, Daily  losartan (COZAAR) tablet, 100 mg, Oral, Daily  magnesium hydroxide (MILK OF MAGNESIA) 400mg  per 5mL oral liquid, 30 mL, Oral, 2x/day  methocarbamol (ROBAXIN) tablet, 500 mg, Oral, 4x/day  NS 250 mL flush bag, , Intravenous, Q15 Min PRN  NS flush syringe, 2-6 mL, Intracatheter, Q8HRS  NS flush syringe, 2-6 mL, Intracatheter, Q1 MIN PRN  oxyCODONE (ROXICODONE) immediate release tablet, 5 mg, Oral, Q4H PRN   Or  oxyCODONE (ROXICODONE) immediate release tablet, 10 mg, Oral, Q4H PRN  pantoprazole (PROTONIX) delayed release tablet, 40 mg, Oral, NIGHTLY  PARoxetine (PAXIL) tablet 30 mg, 30 mg, Oral, QPM  polyethylene glycol (MIRALAX) oral packet, 17 g, Oral, 2x/day  pseudoephedrine (SUDAFED) tablet, 30 mg, Oral, Q6H PRN  rosuvastatin (CRESTOR)  tablet, 10 mg, Oral, QPM  sennosides-docusate sodium (SENOKOT-S) 8.6-50mg  per tablet, 1 Tablet, Oral, 2x/day  SSIP insulin R human (HumuLIN R) 100 units/mL injection, 0-12 Units, Subcutaneous, Q6H PRN  triamterene-hydroCHLOROthiazide (MAXZIDE) 75-50mg  per tablet, 1 Tablet, Oral, Daily      Today's Physical Exam:  GEN: NAD  HEENT: Normocephalic, atraumatic, full ROM w/o pain or neurologic changes  NECK: Non-tender to palpation, full ROM w/o pain or neurologic changes  PULM: Lung sounds clear to ausculation bilaterally. Normal respiratory effort.  CV: Regular rate and rhythm  ABD: Abdomen soft, non-tender, and non-distended  MS: Atraumatic. Distal pulses intact. Normal strength and ROM in all extremities.   Neuro: Alert and oriented to person, place, and time  Vascular: All pulses palpable and equal bilaterally   Integumentary: pink, warm, dry  Wound/incision: Wound margins intact and healing well. No signs of ecchymosis, hyergranulation, hernia, or drainage    Assessment/ Plan:   Active Hospital Problems   (*Primary Problem)    Diagnosis    *Fall    SAH (subarachnoid hemorrhage) (CMS HCC)    Radius fracture    Closed fracture of third cervical vertebra (CMS HCC)    Tachycardia, paroxysmal (CMS HCC)     DVT prophylaxis:  SCDs/ Venodynes/Impulse boots  Anticoagulants (last 24 hours)       None          Nutrition: MNT PROTOCOL FOR DIETITIAN  DIET REGULAR diet, Date of Last Bowel Movement: 06/12/23  Activity: OOB with assist   Pain:   Analgesics (last 24 hours)       Date/Time Action Medication Dose    06/14/23 0844 Given    oxyCODONE (ROXICODONE) immediate release tablet 10 mg    06/14/23 0843 Given    acetaminophen (TYLENOL) tablet 650 mg    06/14/23 0404 Given    oxyCODONE (ROXICODONE) immediate release tablet 10 mg    06/14/23 0025 Given    acetaminophen (TYLENOL) tablet 650 mg    06/14/23 0025 Given    HYDROmorphone (DILAUDID) 0.5 mg/0.5 mL injection 0.2 mg  06/13/23 2129 Given    oxyCODONE (ROXICODONE) immediate  release tablet 10 mg    06/13/23 1826 Given    acetaminophen (TYLENOL) tablet 650 mg    06/13/23 1705 Given    oxyCODONE (ROXICODONE) immediate release tablet 5 mg    06/13/23 1224 Given    acetaminophen (TYLENOL) tablet 650 mg    06/13/23 1224 Given    oxyCODONE (ROXICODONE) immediate release tablet 5 mg          Social: no family at bedside this am   PT Recommendations: home with assist, TBD (following successful stair training)    Plan:     C3 Fracture, c/f C5-C6 Extension Injury   -Neuro intact  -CTAE negative for BCVI  -Ortho spine consult  -MRI C-spine - No ligamentous injury appreciated. Redemonstration of C3 osteophyte fx w small anterior hematoma  -Ok to be up ad lib; [place] at all times   -Will see back in Spine clinic w Josh Long in 1wk     Left distal radius                 -ortho consulted                 -splinted, CCWB, 1 week follow up     TSAH   - GCS 15, not on anticoagulation/antiplatelet medications                 - NSGY consulted                               - Repeat CT Brain - stable, DVT ppx may start 6/19 @ 0700 if patient remains inpatient                               - Keppra x7d, ends 6/24      - F/u in 4 weeks     Tachycardia, paroxysmal                 - troponin negative at outside facility - repeat negative                 - electrolytes WNL                 - EKG NSR on repeat                 - ECHO ordered: Conclusions: There is hyperdynamic left venticular function. No Aortic valve stenosis.                - not not occur OVN on 6/17, continue telemetry/SD    Rhino/enterovirus  -States she just finished z-pack prior to admission w/o improvement in symptoms  -States continued congestion/unrelieved symptoms causing dysphonia (improved since adding sudafed and guaifenesin- Continue, will d/c Augmentin, will add sore throat lozenges)    Oxygen requirement  -On 4L this am, weaned to [facility]  -Unclear etiology, does have virus, no known chest trauma/injuries, obtain CXR  -CXR: Mild  bibasilar atelectasis, No lobar consolidation, pleural effusion or pneumothorax.   -Wean as able     Hypomagnesia  -Replaced     Geri consulted, appreciate recommendations  -Obtain A1C (6.9), vit d (normal), orthostatic VS (negative)  -DEXA outpatient, PCP to taper elavil     Dispo: Pending being weaned from 02, steps with PT/OT, possible d/c today  Lauralyn Primes, APRN,NP-C    I personally saw and evaluated the patient. See mid-level's note for additional details. My findings/participation are     Repeat head C Tstable.   Transfer to stepdown.   PT/OT pending  Consulted geri medicine for assistance.   Start DVT ppx tomorrow.   CXR obtained due to persistent 4L NC O2 requirement. Mild atelectasis bilaterally but no large effusion, consolidation or pneumothorax.   Continue pulmonary toilet.       [name], DO

## 2023-06-15 NOTE — Consults (Addendum)
[facility]  Geriatric Consult  Follow Up Note    Haley Mccall, [place], 72 y.o. female  Date of Service: 06/15/2023  Date of Birth:  05-08-1951    Hospital Day:  LOS: 2 days     Chief Complaint: Follow-up Geriatric Assessment    Subjective: Sitting up in chair at this time.  Cervical collar remains in place.  Alert and oriented x 3.  Follows commands.  Denies pain at this time.  She does endorse nasal congestion with hoarseness. She denied CP, SOB, headache , changes in vision , fever or chills.     Current Facility-Administered Medications   Medication Dose Route Frequency Provider Last Rate Last Admin    acetaminophen (TYLENOL) tablet  650 mg Oral Q6H Isaias Cowman, MD   650 mg at 06/15/23 1006    albuterol (PROVENTIL) 2.5 mg / 3 mL (0.083%) neb solution  2.5 mg Nebulization Q4H PRN Albertine Patricia, MD   2.5 mg at 06/13/23 2358    amitriptyline (ELAVIL) tablet  10 mg Oral NIGHTLY Isaias Cowman, MD   10 mg at 06/14/23 2117    [Held by provider] amLODIPine (NORVASC) tablet  2.5 mg Oral Daily Isaias Cowman, MD   2.5 mg at 06/14/23 0844    benzocaine-menthol (CHLORASEPTIC SORE THROAT) oral lozenge  1 Lozenge Oral Q2H PRN Statler, Fleet Contras, APRN,NP-C        cholecalciferol (VITAMIN D3) 1000 unit (25 mcg) tablet  1,000 Units Oral Daily Isaias Cowman, MD   1,000 Units at 06/15/23 1007    cyanocobalamin (VITAMIN B12) tablet  1,000 mcg Oral Daily Isaias Cowman, MD   1,000 mcg at 06/15/23 1007    D5W 250 mL flush bag   Intravenous Q15 Min PRN Jolaine Artist, MD        gabapentin (NEURONTIN) capsule  200 mg Oral 2x/day Lauralyn Primes, APRN,NP-C   200 mg at 06/15/23 1007    guaiFENesin 100mg  per 5mL oral liquid - for cough (expectorant)  200 mg Oral Q4H PRN Lauralyn Primes, APRN,NP-C   200 mg at 06/15/23 0315    levETIRAcetam (KEPPRA) tablet  500 mg Oral 2x/day Sharlyne Pacas, MD   500 mg at 06/15/23 1006    lidocaine-menthol (LIDOPATCH) 3.6%-1.25% patch  1 Patch Transdermal Daily Lauralyn Primes, APRN,NP-C    1 Patch at 06/15/23 1006    losartan (COZAAR) tablet  100 mg Oral Daily Lauralyn Primes, APRN,NP-C   100 mg at 06/15/23 1007    magnesium hydroxide (MILK OF MAGNESIA) 400mg  per 5mL oral liquid  30 mL Oral 2x/day Lauralyn Primes, APRN,NP-C   2,400 mg at 06/15/23 1004    magnesium sulfate 4 G in SW 100 mL premix IVPB  4 g Intravenous Once Lauralyn Primes, APRN,NP-C 50 mL/hr at 06/15/23 1006 4 g at 06/15/23 1006    methocarbamol (ROBAXIN) tablet  500 mg Oral 4x/day Lauralyn Primes, APRN,NP-C   500 mg at 06/15/23 1006    NS 250 mL flush bag   Intravenous Q15 Min PRN Jolaine Artist, MD        NS flush syringe  2-6 mL Intracatheter Q8HRS Jolaine Artist, MD   2 mL at 06/14/23 1400    NS flush syringe  2-6 mL Intracatheter Q1 MIN PRN Jolaine Artist, MD        oxyCODONE (ROXICODONE) immediate release tablet  5 mg Oral Q4H PRN Isaias Cowman, MD   5 mg at 06/14/23 2115    Or    oxyCODONE (ROXICODONE) immediate release  tablet  10 mg Oral Q4H PRN Isaias Cowman, MD   10 mg at 06/15/23 0345    pantoprazole (PROTONIX) delayed release tablet  40 mg Oral NIGHTLY Isaias Cowman, MD   40 mg at 06/14/23 2115    PARoxetine (PAXIL) tablet 30 mg  30 mg Oral QPM [name], DO   30 mg at 06/14/23 2116    polyethylene glycol (MIRALAX) oral packet  17 g Oral 2x/day Lauralyn Primes, APRN,NP-C   17 g at 06/14/23 2117    pseudoephedrine (SUDAFED) tablet  30 mg Oral Q6H PRN Lauralyn Primes, APRN,NP-C   30 mg at 06/15/23 0315    rosuvastatin (CRESTOR) tablet  10 mg Oral QPM Isaias Cowman, MD   10 mg at 06/14/23 2116    sennosides-docusate sodium (SENOKOT-S) 8.6-50mg  per tablet  1 Tablet Oral 2x/day Isaias Cowman, MD   1 Tablet at 06/15/23 1006    SSIP insulin R human (HumuLIN R) 100 units/mL injection  0-12 Units Subcutaneous Q6H PRN Isaias Cowman, MD        triamterene-hydroCHLOROthiazide Lenard Lance) 75-50mg  per tablet  1 Tablet Oral Daily Isaias Cowman, MD   1 Tablet at 06/15/23 1008      Objective:  Temperature: 36.9 C (98.5 F)  Heart Rate: 89  BP (Non-Invasive): (!) 109/56  Respiratory Rate: 16  SpO2: 95 %  Height: 165.1 cm (5\' 5" )  Weight: 71.5 kg (157 lb 10.1 oz)  BMI (Calculated): 26.29    Comprehensive exam:   Constitutional: No distress  Eyes: Conjunctiva clear., Pupils equal and round.   ENT: ENMT without erythema or injection, mucous membranes moist. Normocephalic,   Neck:  cervical collar   Respiratory: Breathing nonlabored, Clear to auscultation bilaterally.   Cardiovascular: regular rate and rhythm, S1, S2 normal, no murmur, click, rub or gallop  Gastrointestinal:  firm, BS normal in all quads   Genitourinary: Deferred  Musculoskeletal:  normal ROM all extremities  Integumentary:  Skin warm and dry  Neurologic: Grossly normal. Alert and oriented x3  Psychiatric: Normal affect, behavior, memory, thought content, judgement, and speech.      Labs:  I have reviewed all lab results.  Lab Results Today:    Results for orders placed or performed during the hospital encounter of 06/13/23 (from the past 24 hour(s))   POC BLOOD GLUCOSE (RESULTS)   Result Value Ref Range    GLUCOSE, POC 83 70 - 105 mg/dl   VITAMIN D 25 TOTAL   Result Value Ref Range    VITAMIN D 25, TOTAL 51.2 30.0 - 100.0 ng/mL   POC BLOOD GLUCOSE (RESULTS)   Result Value Ref Range    GLUCOSE, POC 100 70 - 105 mg/dl       Imaging Studies:   Results for orders placed or performed during the hospital encounter of 06/13/23 (from the past 24 hour(s))   XR CHEST PA AND LATERAL     Status: None (Preliminary result)    Narrative    XR CHEST PA AND LATERAL performed on 06/15/2023 10:42 AM.  TECHNIQUE: 2 view/2 image(s) submitted for interpretation.  REASON FOR EXAM:  72 years old Female,  increasing o2    COMPARISON: CT chest abdomen pelvis 06/13/2023    FINDINGS:  The heart is normal in size. Lung volumes are low. Minimal bibasilar atelectasis. No lobar consolidation, pleural effusion or pneumothorax. The osseous structures are  diffusely demineralized. C-collar is in place. Postsurgical changes from ACDF are noted. Multiple surgical clips project over the upper abdomen.  Impression    Mild bibasilar atelectasis.         Recent Results (from the past [id number] hour(s))   CT BRAIN WO IV CONTRAST    Collection Time: 06/13/23  7:04 PM    Narrative    Elaina Pattee  Female, 72 years old.      CT BRAIN WO IV CONTRAST performed on 06/13/2023 7:04 PM.    REASON FOR EXAM:  SAH    TECHNIQUE: Axial noncontrast images of the brain from the vertex to the skull base obtained using soft tissue and bone algorithms with reformatted coronal and sagittal images.    RADIATION DOSE: 1078.54 mGy.cm    COMPARISON: CT head from outside institution obtained earlier on 06/13/2023    FINDINGS:  Small amount of acute subarachnoid hemorrhage layering in the right sylvian fissure is stable. There is no new acute intracranial hemorrhage or extra-axial fluid collection. No mass effect or midline shift. Gray-white differentiation is preserved. Patchy periventricular and subcortical white matter lucency is nonspecific, but likely due to small vessel ischemic disease.    Ventricles, basal cisterns, and sulci are prominent due to moderate global cortical atrophy. No hydrocephalus.    There is no calvarial fracture. Mild right frontal extracranial soft tissue swelling consistent with scalp contusion.    Mastoid air cells are well aerated. Moderate opacification of the ethmoid, right maxillary, and left sphenoid sinuses due to mucosal thickening and layering secretions.      Impression    Stable small subarachnoid hemorrhage in the right sylvian fissure. No new acute intracranial hemorrhage or mass effect         Recent Results (from the past [id number] hour(s))   CT CHEST ABDOMEN PELVIS WO IV CONTRAST    Collection Time: 06/13/23 10:16 AM    Narrative    Elaina Pattee  Female, 72 years old.    CT CHEST ABDOMEN PELVIS WO IV CONTRAST performed on 06/13/2023 10:16 AM.    REASON FOR  EXAM:  spine pain, fall    RADIATION DOSE: 443.98 mGy.cm    COMPARISON: None    FINDINGS:  Lungs are clear. No pneumothorax or pleural effusion or pulmonary edema seen. There is some atherosclerotic plaque in the aorta and coronary arteries.    Patient has had a cholecystectomy. No biliary dilatation seen. Pancreas shows some atrophy. Kidneys have bilateral parapelvic cysts. No stones or hydronephrosis identified.    There is a moderate stool burden in the colon. No bowel obstruction or perforation seen. Appendix appears unremarkable.    There is no evidence for acute fracture or dislocation. Mild degenerative changes present.      Impression    No acute traumatic injury identified.       Recent Results (from the past [id number] hour(s))   XR CHEST PA AND LATERAL    Collection Time: 06/15/23 10:42 AM    Narrative    XR CHEST PA AND LATERAL performed on 06/15/2023 10:42 AM.  TECHNIQUE: 2 view/2 image(s) submitted for interpretation.  REASON FOR EXAM:  72 years old Female,  increasing o2    COMPARISON: CT chest abdomen pelvis 06/13/2023    FINDINGS:  The heart is normal in size. Lung volumes are low. Minimal bibasilar atelectasis. No lobar consolidation, pleural effusion or pneumothorax. The osseous structures are diffusely demineralized. C-collar is in place. Postsurgical changes from ACDF are noted. Multiple surgical clips project over the upper abdomen.      Impression    Mild bibasilar atelectasis.  Recent Results (from the past [id number] hour(s))   MRI SPINE CERVICAL WO CONTRAST    Collection Time: 06/13/23 11:30 PM    Narrative    Elaina Pattee  Female, 72 years old.    MRI SPINE CERVICAL WO CONTRAST performed on 06/13/2023 11:30 PM.    REASON FOR EXAM:  Ligamentous injury      TECHNIQUE: Noncontrast MRI of the cervical spine.    COMPARISON: CT performed on June 13, 2023    FINDINGS:  Again noted is solid anterior fusion from C5 through C7 with an anterior plate noted at W0-J8. There is unchanged slight  anterolisthesis of C4 on C5. A fracture of the anterior-inferior osteophyte of C3 was better seen on the previous CT examination. No definitive marrow edema is identified.    There is ill-defined prevertebral edema and hematoma from the clivus to the C4 level measuring about 9 mm in thickness. No ligamentous disruption is identified at C1-C2 or at the craniocervical junction. There is partial tearing of the anterior longitudinal ligament at the level of the C3 osteophyte fracture. The posterior longitudinal ligament is intact. No posterior ligamentous injury is identified. No cord signal abnormality is identified.    C2-C3: No spinal canal or foraminal stenosis is identified.    C3-C4: There is moderate anterior osteophytosis. There is moderate bilateral uncovertebral joint spurring and mild disc bulging. There is minimal spinal canal stenosis. There is moderate bilateral foraminal narrowing.    C4-C5: There is moderate anterior osteophytosis. There is mild to moderate bilateral uncovertebral joint spurring. There is disc bulging with posterior endplate osteophytosis. There is moderate to severe left and mild to moderate right facet arthropathy with ligamentum flavum infolding. There is moderate spinal canal stenosis. There is mild to moderate left worse than right bilateral foraminal narrowing.    C5-C6: There is small posterior endplate osteophytosis. There is osseous fusion mild hypertrophy of the right facet joint. There is mild left facet arthropathy. There is no significant spinal canal stenosis. There is minimal right foraminal narrowing.    C6-C7: There is small posterior endplate osteophytosis and mild bilateral facet arthropathy. No significant spinal canal or foraminal narrowing.    C7-T1: There is moderate left and mild right facet arthropathy. There is no spinal canal stenosis. There is mild left foraminal narrowing.    T1-T2 there is a small central disc protrusion with associated annular fissure  causing minimal spinal canal narrowing.      Impression     Nondisplaced fracture of the C3 anterior inferior osteophyte, better seen on previous CTA. There is some prevertebral edema and small partial tearing of the anterior longitudinal ligament. The posterior longitudinal ligament and posterior ligamentous complexes are intact               Assessment/Recommendations:    1) Geriatric fall: Multifactorial including mechanical, polypharmacy, cardiac arrhyhtmia, SAH, C3, C5-C6 fracture, nondisplaced left distal radius fracture   - Vitamin D WNL  - obtain albumin for corrected calcium   - PT/OT  - Dexa scan as outpatient  - Diabetes  - Hba1c- 6.9          - Agree with ISS  - restart home medications upon discharge   - BP low normal         -Continue triamtirene/ HCTZ (hold if SBP <100)         -Hold amlodipine and losartan   - Polypharmacy           - Taper elavil  5 mg PO daily for 10 days then, D/C  - Discontinue guaifenesin d/t anticholinergic effects   - Anemia and anticoagulation per primary team  -SVT  (sinus rhythm currently)          -Agree ECHO (trace mitral/tricuspid regurgitations)          -Troponin (-) x 2          -Continue Telemetry          - Orthostatic vitals- Positive                    -BP medications as above                    -Hydration                    -ECHO as above                     -Correct anemia per primary team                    - Patient was educated about changing  positions                    -If persistent orthostatic changes, consider cardiology evaluation.  - Sinus congestion /Hoarseness   - Covid Negative on 6/18  - Daily CXR  -Check strep throat test  -Pain           -Continue scheduled tylenol every 6 hours and oxy 5mg  PO Q 4 hours PRN           -Continue gabapentin 100 mg Po daily           -Discontinue Robaxin and oxy 10 mg  -Continue Paxil for depression/anxiety     2) Low risk for delirium  - Symptomatic Management (strong day and night cues  - sleep hygiene, frequent  toileting  - PT/OT mobility as condition allows it   - bowel regiment to prevent constipation  - encourage visitation from family  - avoiding antihistamines and medications with anticholinergic effects)     Please have patient [facility] up in [facility] following discharge   Thank you for this Geriatric Consult .  Recommendations were sent to [facility] APRN with Trauma     Geriatrics will sign off . Please notify geriatrics if any further questions     Total Time of Encounter:  On the day of this encounter I spent a total of  50 minutes on the care of this patient.  This included communication with the  primary team,  evaluation of the patient, chart review, charting, table rounds with Attending physician , rounding with geriatric team and post rounding activities.      Pixie Casino, [id number]

## 2023-06-15 NOTE — Nurses Notes (Signed)
06/15/23 1230 06/15/23 1245 06/15/23 1300   Vital Signs   BP (Non-Invasive) (!) 123/53 (!) 99/52 (!) 133/55   BP Source (Non-Invasive) C;Monitor C;Monitor C;Monitor   Patient Position Supine Sitting Standing     Orthostatic BP shown above. Will continue to monitor.

## 2023-06-15 NOTE — Nurses Notes (Signed)
Home oxygen challenge complete. See results below. Pt SpO2 WNL when awake. When sleeping, pt SpO2 87-88% on RA. Will continue to monitor.        06/15/23 1346        Home Oxygen Assessment   O2 sat on Room Air at Rest 88   O2 sat on Oxygen at rest 94   O2 sat on Room Air while Ambulating 95   O2 sat on NC while Ambulating 95   Oxygen Amount (LPM) 2   $Home Oxygen Challenge Completed Completed

## 2023-06-16 ENCOUNTER — Inpatient Hospital Stay (HOSPITAL_COMMUNITY): Payer: Medicare Other

## 2023-06-16 ENCOUNTER — Other Ambulatory Visit: Payer: Self-pay

## 2023-06-16 DIAGNOSIS — S299XXA Unspecified injury of thorax, initial encounter: Secondary | ICD-10-CM

## 2023-06-16 DIAGNOSIS — S066X0A Traumatic subarachnoid hemorrhage without loss of consciousness, initial encounter: Secondary | ICD-10-CM

## 2023-06-16 DIAGNOSIS — S52501A Unspecified fracture of the lower end of right radius, initial encounter for closed fracture: Secondary | ICD-10-CM

## 2023-06-16 LAB — BASIC METABOLIC PANEL
ANION GAP: 13 mmol/L (ref 4–13)
BUN/CREA RATIO: 17 (ref 6–22)
BUN: 14 mg/dL (ref 8–25)
CALCIUM: 8.8 mg/dL (ref 8.6–10.3)
CHLORIDE: 98 mmol/L (ref 96–111)
CO2 TOTAL: 22 mmol/L — ABNORMAL LOW (ref 23–31)
CREATININE: 0.81 mg/dL (ref 0.60–1.05)
ESTIMATED GFR - FEMALE: 78 mL/min/BSA (ref >=60)
GLUCOSE: 124 mg/dL (ref 65–125)
POTASSIUM: 3.4 mmol/L — ABNORMAL LOW (ref 3.5–5.1)
SODIUM: 133 mmol/L — ABNORMAL LOW (ref 136–145)

## 2023-06-16 LAB — CBC
HCT: 32.7 % — ABNORMAL LOW (ref 34.8–46.0)
HGB: 10.9 g/dL — ABNORMAL LOW (ref 11.5–16.0)
MCH: 29.5 pg (ref 26.0–32.0)
MCHC: 33.3 g/dL (ref 31.0–35.5)
MCV: 88.4 fL (ref 78.0–100.0)
MPV: 9.1 fL (ref 8.7–12.5)
PLATELETS: 279 10*3/uL (ref 150–400)
RBC: 3.7 10*6/uL — ABNORMAL LOW (ref 3.85–5.22)
RDW-CV: 13.4 % (ref 11.5–15.5)
WBC: 7.9 10*3/uL (ref 3.7–11.0)

## 2023-06-16 LAB — PHOSPHORUS: PHOSPHORUS: 2.2 mg/dL — ABNORMAL LOW (ref 2.3–4.0)

## 2023-06-16 LAB — MAGNESIUM: MAGNESIUM: 2.7 mg/dL — ABNORMAL HIGH (ref 1.8–2.6)

## 2023-06-16 LAB — POC BLOOD GLUCOSE (RESULTS)
GLUCOSE, POC: 118 mg/dl — ABNORMAL HIGH (ref 70–105)
GLUCOSE, POC: 133 mg/dl — ABNORMAL HIGH (ref 70–105)

## 2023-06-16 MED ORDER — SODIUM DI- AND MONOPHOSPHATE-POTASSIUM PHOS MONOBASIC 250 MG TABLET
500.0000 mg | ORAL_TABLET | Freq: Four times a day (QID) | ORAL | Status: AC
Start: 2023-06-16 — End: 2023-06-16
  Administered 2023-06-16 (×2): 500 mg via ORAL
  Filled 2023-06-16 (×2): qty 2

## 2023-06-16 MED ORDER — POTASSIUM CHLORIDE ER 20 MEQ TABLET,EXTENDED RELEASE(PART/CRYST)
40.0000 meq | ORAL_TABLET | ORAL | Status: AC
Start: 2023-06-16 — End: 2023-06-16
  Administered 2023-06-16: 40 meq via ORAL
  Filled 2023-06-16: qty 2

## 2023-06-16 MED ORDER — SODIUM DI- AND MONOPHOSPHATE-POTASSIUM PHOS MONOBASIC 250 MG TABLET
1.0000 | ORAL_TABLET | Freq: Four times a day (QID) | ORAL | 0 refills | Status: AC
Start: 2023-06-16 — End: 2023-06-17
  Filled 2023-06-16: qty 1, 1d supply, fill #0

## 2023-06-16 NOTE — Care Plan (Signed)
[facility]  Rehabilitation Services  Physical Therapy Initial Evaluation    Patient Name: Haley Mccall  Date of Birth: Oct 18, 1951  Height: Height: 165.1 cm (5\' 5" )  Weight: Weight: 71.5 kg (157 lb 10.1 oz)  Room/Bed: 17/A  Payor: MEDICARE / Plan: MEDICARE PART A AND B / Product Type: Medicare /     Assessment:      Haley Mccall presented less symptomatic and had better vital sign responses to therapy today, compared to previous session. Pt was able to do longer bouts of therapy at a more intensive level with no subjective reports of symptoms. Pt was SBA with all activities, due to mild LOB episodes that were self corrected. Pt received a straight cane for more balance durning gait, which proved to be effective. PT to recommend cane use for now and given the evolving clinical picture of Haley Mccall durning her stay, PT to recommend home health therapy for d/c due to functional mobility safety concerns.    Discharge Needs:   Equipment Recommendation: standard cane (Pt already recieved)  Discharge Disposition: home with home health, home with 24/7 assistance    JUSTIFICATION OF DISCHARGE RECOMMENDATION   Based on current diagnosis, functional performance prior to admission, and current functional performance, this patient requires continued PT services in home with home health, home with 24/7 assistance in order to achieve significant functional improvements in these deficit areas: aerobic capacity/endurance, gait, locomotion, and balance, muscle performance.        Plan:   Current Intervention: balance training, bed mobility training, gait training, strengthening, stair training, ROM (range of motion), patient/family education, motor coordination training  To provide physical therapy services 1x/day, minimum of 3x/week  for duration of until discharge.    The risks/benefits of therapy have been discussed with the patient/caregiver and he/she is in agreement with the established plan of care.       Subjective  & Objective        06/16/23 1139   Therapist Pager   PT Assigned/ Pager # Clayburn Pert [phone] / Toniann Fail [phone]   Rehab Session   Document Type therapy progress note (daily note)   PT Visit Date 06/16/23   Total PT Minutes: 28   Patient Effort good   Symptoms Noted During/After Treatment none   General Information   Patient Profile Reviewed yes   Onset of Illness/Injury or Date of Surgery 06/13/23   Medical Lines PIV Line;Telemetry   Respiratory Status room air   Existing Precautions/Restrictions full code;fall precautions;weight bearing restriction;c-collar;droplet isolation   General Observations of Patient Pt was seen on 8NE and was alert and agreeable to PT session.   Weight-bearing Status   Extremity Precautions left upper extremity   Left Upper Extremity toe touch weight-bearing (TTWB)   Mutuality/Individual Preferences   Individualized Care Needs OOB x1 with straight cane   Patient-Specific Goals (Include Timeframe) to go home   Plan of Care Reviewed With patient   Pre Treatment Status   Pre Treatment Patient Status Patient supine in bed;Call light within reach;Telephone within reach;Patient safety alarm activated;Nurse approved session;Venodynes in place and activated   Support Present Pre Treatment  None   Communication Pre Treatment  Nurse   Communication Pre Treatment Comment RN approved session   Cognitive Assessment/Interventions   Behavior/Mood Observations behavior appropriate to situation, WNL/WFL;cooperative;alert   Attention WNL/WFL   Follows Commands WNL   Vital Signs   Pre-Treatment Heart Rate (beats/min) 88   Post-treatment Heart Rate (beats/min) 104   Pre-Treatment Resp  Rate (breaths/min) 28   Post-treatment Resp Rate (breaths/min) 30   Pre Treatment BP 134/72   Post Treatment BP 125/70   Pre SpO2 (%) 93   O2 Delivery Pre Treatment room air   Post SpO2 (%) 96   O2 Delivery Post Treatment room air   Vitals Comment pt reported no feelings of SOB or symptoms of hypotension as compared to last session.   Pain  Assessment   Pretreatment Pain Rating 0/10 - no pain   Posttreatment Pain Rating 0/10 - no pain   Pre/Posttreatment Pain Comment No complaints of pain throughout treatment.   Mobility Assessment/Training   Additional Documentation Stairs Assessment/Treatment (Group)   Bed Mobility Assessment/Treatment   Bed Mobility, Assistive Device Head of Bed Elevated   Supine-Sit Independence stand-by assistance   Safety Issues decreased use of arms for pushing/pulling   Impairments strength decreased;ROM decreased   Comment Able to maintain TTWB L UE.   Transfer Assessment/Treatment   Sit-Stand Independence stand-by assistance   Stand-Sit Independence stand-by assistance   Bed-Chair Independence stand-by assistance   Toilet Transfer Independence stand-by assistance   Toilet Transfer Assist Device grab bar   Transfer Safety Issues balance decreased during turns;sequencing ability decreased;step length decreased;weight-shifting ability decreased   Transfer Impairments balance impaired;coordination impaired;endurance;ROM decreased;strength decreased   Transfer Comment Pt transfers resulted in no reports of symptoms.   Gait Assessment/Treatment   Total Distance Ambulated 225   Independence  stand-by assistance   Assistive Device  other (see comments)  (Pt used no AD for the first bout of gait, we introduced a straight cane to see if the patient would feel safer and look more stable during gait. Pt was looking more stable with it and feels indifferent about it, and claims she will keep giving AD a try.)   Distance in Feet 110 x 2 from room to stairs   Gait Speed slow but functional   Deviations  cadence decreased;double stance time increased;increased trunk sway;step length decreased;weight-shifting ability decreased   Maintain Weight Bearing Status able to maintain   Safety Issues  balance decreased during turns;sequencing ability decreased;step length decreased;weight-shifting ability decreased   Impairments  balance  impaired;coordination impaired;endurance;postural control impaired;strength decreased   Comment Pt is able to perform gait with SBA but pt demonstrates mild episodes of a LOB that are corrected by the patient without physical help from the PT. Pt looks more stable with straight cane, encourage use.   Stairs Assessment/Treatment   Number of Stairs 14   Impairments balance impaired;coordination impaired;endurance;strength decreased   Comment Pt required rest before and after stair training and between ascending and descending. Pt was stable. Turned sideways going down stairs to hold L sided HR with her R hand.   Handrail Location right side (ascending)   Independence Level stand-by assistance   Safety Issues sequencing ability decreased   Technique Used step to step (ascending);step to step (descending)   Maintain Weight Bearing Status able to maintain weight bearing status   Balance   Comment w/straight cane   Sitting Balance: Static normal balance   Sitting, Dynamic (Balance) good balance   Sit-to-Stand Balance good balance   Standing Balance: Static good balance   Standing Balance: Dynamic fair + balance   Systems Impairment Contributing to Balance Disturbance musculoskeletal   Identified Impairments Contributing to Balance Disturbance impaired postural control;decreased strength   Post Treatment Status   Post Treatment Patient Status Patient sitting in bedside chair or w/c;Call light within reach;Telephone within reach;Venodynes in place and  activated;Patient safety alarm activated   Support Present Post Treatment  None   Communication Post Treatment Nurse   Communication Post Treatment Comment pt performance and status   Plan of Care Review   Plan Of Care Reviewed With patient   Basic Mobility Am-PAC/6Clicks Score (APPROVED Staff)   Turning in bed without bedrails 4   Lying on back to sitting on edge of flat bed 4   Moving to and from a bed to a chair 4   Standing up from chair 4   Walk in room 3   Climbing 3-5  steps with railing 3   6 Clicks Raw Score total 22   Standardized (t-scale) score 47.4   Patient Mobility Goal (JHHLM) 7- Walk 25 feet or more 3X/day   Exercise/Activity Level Performed 7- Walked 25 feet or more   Functional Impairment   Overall Functional Impairments/Problem List balance impaired;coordination impaired;endurance;ROM decreased;strength decreased   Physical Therapy Clinical Impression   Assessment Mrs. Marion presented less symptomatic and had better vital sign responses to therapy today, compared to previous session. Pt was able to do longer bouts of therapy at a more intensive level with no subjective reports of symptoms. Pt was SBA with all activities, due to mild LOB episodes that were self corrected. Pt received a straight cane for more balance during gait, which proved to be effective. PT to recommend cane use for now and given the evolving clinical picture of Mrs. Donner during her stay, PT to recommend home health therapy for d/c due to functional mobility safety concerns.   Patient/Family Goals Statement to be d/c   Anticipated Equipment Needs at Discharge (PT) standard cane  (Pt already received)   Anticipated Discharge Disposition home with home health;home with 24/7 assistance   Gait Training  Goal, Distance to Achieve   Gait Training  Goal, Date Goal Reviewed/Revised  06/16/23   Gait Training Goal, Outcome goal partially met   Stairs Training Goal   Stairs Training Goal, Outcome goal met       Therapist:   Margarito Courser, PHYSICAL THERAPY STUDENT   Pager #: [phone]  I was physically present and immediately available during the student's provision of patient care. I have reviewed the student's note and agree with the findings and plan of care as documented. Any exceptions or additions are edited/noted.    Camelia Eng Nicola Police, PT  06/16/2023 19:10  Pager Number: [phone]

## 2023-06-16 NOTE — Care Plan (Signed)
[facility]  Rehabilitation Services  Occupational Therapy Progress Note    Patient Name: Haley Mccall  Date of Birth: 10-31-1951  Height:  165.1 cm (5\' 5" )  Weight:  71.5 kg (157 lb 10.1 oz)  Room/Bed: 17/A  Payor: MEDICARE / Plan: MEDICARE PART A AND B / Product Type: Medicare /     Assessment:    Haley Mccall tolerated OT tx session well this date. Functionally, pt able to complete all observed bed mobility, transfers and ambulation of greater than household distances w/o physical (A). Noted minor instances of postural sway at times, but self-corrected quickly w/o additional assistance. Issued SPC to increased safety for ambulation of longer distances with pt demonstrating improved stability and confidence with use. Able to negotiate full flight of stairs with use of RHR, requiring cueing and SBA to sequence step to step pattern. Pt able to maintain WB restrictions throughout with all OOB activity. Pt is managing ADLs well and will have good support upon d/c from spouse. At this time, recommending home with assistance and home health services once medically appropriate at d/c to maximize functional recovery.    Discharge Needs:   Equipment Recommendation: shower chair    The patient presents with mobility limitations due to impaired balance, impaired range of motion, impaired strength, weight bearing restrictions, and impaired functional activity tolerance that significantly impair/prevent patient's ability to participate in mobility-related activities of daily living (MRADLs) including  bathing. This functional mobility deficit can be sufficiently resolved with the use of a shower chair in order to decrease the risk of falls, morbidity, and mortality in performance of these MRADLs.  Patient is able to safely use this assistive device.    Discharge Disposition:  home with assist & home with home health   JUSTIFICATION OF DISCHARGE RECOMMENDATION   Based on current diagnosis, functional performance prior to  admission, and current functional performance, this patient requires continued OT services in home with assist, home with home health in order to achieve significant functional improvements.  The above recommendation is based upon the current examination and evaluation performed on this date. As subsequent sessions are completed, recommendations will be updated accordingly.  Plan:   Continue to follow patient according to established plan of care.  The risks/benefits of therapy have been discussed with the patient/caregiver and he/she is in agreement with the established plan of care.     Subjective & Objective:      06/16/23 1140   Therapist Pager   OT Assigned/ Pager # Haley Mccall s. [phone]   Rehab Session   Document Type therapy progress note (daily note)   OT Visit Date 06/16/23   Total OT Minutes: 27   Patient Effort good   Symptoms Noted During/After Treatment other (see comments)   Symptoms Noted Comment reports she has been feeling "woozy" & "light headed" at times prior to OT tx session, but denied symptoms during session.   General Information   Patient Profile Reviewed yes   Onset of Illness/Injury or Date of Surgery 06/13/23   Medical Lines PIV Line;Telemetry   Respiratory Status room air   Existing Precautions/Restrictions full code;fall precautions;droplet isolation;c-collar;weight bearing restriction  (CCWB LUE)   Pre Treatment Status   Pre Treatment Patient Status Patient supine in bed;Call light within reach;Telephone within reach;Patient safety alarm activated;Nurse approved session   Support Present Pre Treatment  None   Communication Pre Treatment  Nurse   Communication Pre Treatment Comment RN medically cleared pt for therapy   Mutuality/Individual  Preferences   Anxieties, Fears or Concerns no concerns regarding d/c home   Individualized Care Needs OOB Ax1 using straight cane PRN   Patient-Specific Goals (Include Timeframe) to go home   Plan of Care Reviewed With patient   Patient would like to  participate in bedside shift report Yes   Vital Signs   Pre Treatment BP 134/72   Post Treatment BP 125/70   O2 Delivery Pre Treatment room air   O2 Delivery Post Treatment room air   Vitals Comment denied dizziness, reporting "wooziness" and "light headedness" at times prior to OT tx session; denied symptoms during tx session   Pain Assessment   Pre/Posttreatment Pain Comment no c/o pain   Coping/Psychosocial Response Interventions   Plan Of Care Reviewed With patient   Cognitive Assessment/Interventions   Behavior/Mood Observations cooperative;alert;behavior appropriate to situation, WNL/WFL   Orientation Status oriented x 4   Attention WNL/WFL   Follows Commands WNL   Mobility Assessment/Training   Mobility Comment ambulated 229ft with SBA, noting minor postural sway at times but self-corrected quickly w/o additional assistance. PT issued straight cane with education provided on proper use. Demo'd improved performance and confidence with DME use; continue recommendig use temporarily PRN during recovery process. Able to negotiate full flight of stairs using RHR for support. Required a lateral step to step pattern for descending stairs to maintain CCWB status in LUE.   Bed Mobility Assessment/Treatment   Bed Mobility, Assistive Device Head of Bed Elevated   Supine-Sit Independence stand-by assistance   Safety Issues decreased use of arms for pushing/pulling   Impairments ROM decreased;strength decreased   Comment towards (R) side to maximize performance while maintaining CCWB status in LUE   Transfer Assessment/Treatment   Sit-Stand Independence stand-by assistance   Stand-Sit Independence stand-by assistance   Sit-Stand-Sit, Assist Device other (see comments)  (no DME initially, introduced cane during session)   Toilet Transfer Independence stand-by assistance   Toilet Transfer Assist Device grab bar   Transfer Safety Issues balance decreased during turns;weight-shifting ability decreased   Transfer Impairments  balance impaired;endurance;postural control impaired;strength decreased   Upper Body Dressing Assessment/Training   Position  sitting   DRESSING ASSESSED   (doff/don gown)   ASSISTANCE REQUIRED DONNING Shirt fasten   ASSISTANCE REQUIRED DOFFING Shirt unfasten   Independence Level  minimum assist (75% patient effort)   Impairments  activity tolerance impaired;balance impaired;coordination impaired;ROM decreased;strength decreased   Comment educated on hemi dressing technique to compensate for CCWB status in LUE; pt returning good understanding and carryover skills   Lower Body Dressing Assessment/Training   Position sitting   DRESSING ASSESSED Don Socks   Independence Level  set up required;verbal cues required   Impairments activity tolerance impaired;balance impaired;ROM decreased;strength decreased   Comment in figure four position, able to maintain WB restrictions   Toileting Assessment/Training   Position sitting   TOILETING ASSESSED Adjust clothing prior;Adjust clothing after;Perineal hygiene   Independence Level  supervision required   Impairments activity tolerance impaired;balance impaired;coordination impaired;strength decreased;ROM decreased   Balance   Comment w/straight cane   Sitting Balance: Static good balance   Sitting, Dynamic (Balance) fair + balance   Sit-to-Stand Balance fair balance   Standing Balance: Static fair balance   Standing Balance: Dynamic fair balance   Systems Impairment Contributing to Balance Disturbance musculoskeletal   Identified Impairments Contributing to Balance Disturbance impaired postural control;impaired coordination;decreased strength;decreased ROM   Post Treatment Status   Post Treatment Patient Status Patient sitting in bedside chair or  w/c;Telephone within reach;Call light within reach;Patient safety alarm activated   Support Present Post Treatment  None   Communication Post Treatement Nurse   Communication Post Treatment Comment pt performance; OOB recs   Clinical  Impression   Functional Level at Time of Session Ms. Shell tolerated OT tx session well this date. Functionally, pt able to complete all observed bed mobility, transfers and ambulation of greater than household distances w/o physical (A). Noted minor instances of postural sway at times, but self-corrected quickly w/o additional assistance. Issued SPC to increased safety for ambulation of longer distances with pt demonstrating improved stability and confidence with use. Able to negotiate full flight of stairs with use of RHR, requiring cueing and SBA to sequence step to step pattern. Pt able to maintain WB restrictions thorughout with all OOB activity. Pt is managing ADLs well and will have good support upon d/c from spouse. At this time, recommending home with assistance and home health services once medically appropriate at d/c to maximize functional recovery.   Anticipated Equipment Needs at Discharge shower chair   Anticipated Discharge Disposition home with assist;home with home health   Highest level of Mobility score   Exercise/Activity Level Performed 7- Walked 25 feet or more     OT Education: Reinforced role of OT at [facility], reviewed goals of session, energy conservation, falls prevention/home safety strategies, importance of engagement in upright/out of bed activity and engagement in ADL routine during hospital stay.    Therapist:   Elijah Birk, OT  Pager #: [phone]

## 2023-06-16 NOTE — Consults (Signed)
[facility]                                                      Trauma Progress Note                 Date of Birth:  December 24, 1951  Date of Admission:  06/13/2023  Date of service: 06/16/2023    Haley Mccall, 72 y.o., female Post trauma day 1 status post fall.    Hospital Course:   6/18: P2 Transfer from Outside [facility], s/p mechanical FFS, CT Brain, CT C-spine, CT face, and x-rays at outside facility, found to have R sylvian fissure tSAH, C3 fracture, C5-C6 extension injury, nondisplaced L distal radius fracture. Had several runs of paroxysmal SVT and tachycardia in ED and CT. Admit to SICU.     6/19: Repeat CT brain stable, non-op ortho plans, transfer to SD, reg diet, pain control, geri consult, PT/OT  6/20: PT/OT for dispo plan       Events over the last 24 hours have included:  See above    Subjective:    NAEO. Pt states her throat and cough has improved. Pain well controlled. Tolerating diet.     Objective   24 Hour Summary:    Filed Vitals:    06/15/23 2200 06/15/23 2315 06/15/23 2330 06/16/23 0300   BP:  114/63  (!) 135/58   Pulse: 90 91 92 91   Resp: 19  (!) 22 20   Temp:   36.8 C (98.3 F) 36.7 C (98.1 F)   SpO2: 97% 92% 94% 94%     Labs:  Recent Labs     06/13/23  0956 06/13/23  0958 06/13/23  1523 06/14/23  0022 06/16/23  0529   WBC 12.7*  --   --  10.6 7.9   HGB 10.6*  --   --  10.2* 10.9*   HCT 32.7*  --   --  31.6* 32.7*   SODIUM 135* 133*  --  136 133*   POTASSIUM 3.7  --   --  3.7 3.4*   CHLORIDE 99 100  --  101 98   BICARBONATE  --  25.3  --   --   --    BUN 16  --   --  14 14   CREATININE 0.79  --   --  0.73 0.81   GLUCOSE 131* 136* >=1000* 78 124   ANIONGAP 12  --   --  14* 13   CALCIUM 9.8  --   --  8.5* 8.8   MAGNESIUM  --   --   --  1.5* 2.7*   PHOSPHORUS  --   --   --  2.7 2.2*   INR 1.03  --   --   --   --      In: 705 [P.O.:600; I.V.:5]  Out: -   Nutrition Management: MNT PROTOCOL FOR DIETITIAN  DIET REGULAR Date of Last Bowel Movement: 06/12/23  No results  for input(s): "ALBUMIN", "PREALBUMIN" in the last 72 hours.  Current Medications:  acetaminophen (TYLENOL) tablet, 650 mg, Oral, Q6H  albuterol (PROVENTIL) 2.5 mg / 3 mL (0.083%) neb solution, 2.5 mg, Nebulization, Q4H PRN  amitriptyline (ELAVIL) tablet, 10 mg, Oral, NIGHTLY  [Held by provider] amLODIPine (NORVASC) tablet, 2.5 mg, Oral, Daily  benzocaine-menthol (CHLORASEPTIC SORE THROAT) oral lozenge, 1 Lozenge, Oral, Q2H PRN  cholecalciferol (VITAMIN D3) 1000 unit (25 mcg) tablet, 1,000 Units, Oral, Daily  cyanocobalamin (VITAMIN B12) tablet, 1,000 mcg, Oral, Daily  D5W 250 mL flush bag, , Intravenous, Q15 Min PRN  enoxaparin PF (LOVENOX) 30 mg/0.3 mL SubQ injection, 30 mg, Subcutaneous, Q12H  gabapentin (NEURONTIN) capsule, 200 mg, Oral, 2x/day  guaiFENesin 100mg  per 5mL oral liquid - for cough (expectorant), 200 mg, Oral, Q4H PRN  levETIRAcetam (KEPPRA) tablet, 500 mg, Oral, 2x/day  lidocaine-menthol (LIDOPATCH) 3.6%-1.25% patch, 1 Patch, Transdermal, Daily  [Held by provider] losartan (COZAAR) tablet, 100 mg, Oral, Daily  magnesium hydroxide (MILK OF MAGNESIA) 400mg  per 5mL oral liquid, 30 mL, Oral, 2x/day  methocarbamol (ROBAXIN) tablet, 500 mg, Oral, 4x/day  NS 250 mL flush bag, , Intravenous, Q15 Min PRN  NS flush syringe, 2-6 mL, Intracatheter, Q8HRS  NS flush syringe, 2-6 mL, Intracatheter, Q1 MIN PRN  oxyCODONE (ROXICODONE) immediate release tablet, 5 mg, Oral, Q4H PRN   Or  oxyCODONE (ROXICODONE) immediate release tablet, 10 mg, Oral, Q4H PRN  pantoprazole (PROTONIX) delayed release tablet, 40 mg, Oral, NIGHTLY  PARoxetine (PAXIL) tablet 30 mg, 30 mg, Oral, QPM  polyethylene glycol (MIRALAX) oral packet, 17 g, Oral, 2x/day  pseudoephedrine (SUDAFED) tablet, 30 mg, Oral, Q6H PRN  rosuvastatin (CRESTOR) tablet, 10 mg, Oral, QPM  sennosides-docusate sodium (SENOKOT-S) 8.6-50mg  per tablet, 1 Tablet, Oral, 2x/day  SSIP insulin R human (HumuLIN R) 100 units/mL injection, 0-12 Units, Subcutaneous, Q6H  PRN  triamterene-hydroCHLOROthiazide (MAXZIDE) 75-50mg  per tablet, 1 Tablet, Oral, Daily      Today's Physical Exam:  GEN: NAD  HEENT: Normocephalic, atraumatic, full ROM w/o pain or neurologic changes  NECK: Non-tender to palpation, full ROM w/o pain or neurologic changes  PULM: Lung sounds clear to ausculation bilaterally. Normal respiratory effort.  CV: Regular rate and rhythm  ABD: Abdomen soft, non-tender, and non-distended  MS: Atraumatic. Distal pulses intact. Normal strength and ROM in all extremities.   Neuro: Alert and oriented to person, place, and time  Vascular: All pulses palpable and equal bilaterally   Integumentary: pink, warm, dry  Wound/incision: Wound margins intact and healing well. No signs of ecchymosis, hyergranulation, hernia, or drainage    Assessment/ Plan:   Active Hospital Problems   (*Primary Problem)    Diagnosis    *[facility] (subarachnoid hemorrhage) (CMS HCC)    Radius fracture    C3 cervical fracture (CMS HCC)    Tachycardia, paroxysmal (CMS HCC)     DVT prophylaxis:  SCDs/ Venodynes/Impulse boots  Anticoagulants (last 24 hours)       Date/Time Action Medication Dose    06/15/23 2113 Given    enoxaparin PF (LOVENOX) 30 mg/0.3 mL SubQ injection 30 mg          Nutrition: MNT PROTOCOL FOR DIETITIAN  DIET REGULAR diet, Date of Last Bowel Movement: 06/12/23  Activity: OOB with assist   Pain:   Analgesics (last 24 hours)       Date/Time Action Medication Dose    06/14/23 0844 Given    oxyCODONE (ROXICODONE) immediate release tablet 10 mg    06/14/23 0843 Given    acetaminophen (TYLENOL) tablet 650 mg    06/14/23 0404 Given    oxyCODONE (ROXICODONE) immediate release tablet 10 mg    06/14/23 0025 Given    acetaminophen (TYLENOL) tablet 650 mg    06/14/23 0025 Given    HYDROmorphone (DILAUDID) 0.5 mg/0.5 mL injection 0.2  mg    06/13/23 2129 Given    oxyCODONE (ROXICODONE) immediate release tablet 10 mg    06/13/23 1826 Given    acetaminophen (TYLENOL) tablet 650 mg    06/13/23 1705 Given     oxyCODONE (ROXICODONE) immediate release tablet 5 mg    06/13/23 1224 Given    acetaminophen (TYLENOL) tablet 650 mg    06/13/23 1224 Given    oxyCODONE (ROXICODONE) immediate release tablet 5 mg          Social: no family at bedside this am   PT Recommendations: home with assist, TBD    Plan:     C3 Fracture, c/f C5-C6 Extension Injury   -Neuro intact  -CTAE negative for BCVI  -Ortho spine consult  -MRI C-spine - No ligamentous injury appreciated. Redemonstration of C3 osteophyte fx w small anterior hematoma  -Ok to be up ad lib; [place] at all times   -Will see back in Spine clinic w Josh Long in 1wk     Left distal radius                 -ortho consulted                 -splinted, CCWB, 1 week follow up     tSAH   - GCS 15, not on anticoagulation/antiplatelet medications                 - NSGY consulted                               - Repeat CT Brain - stable, DVT ppx may start 6/19 @ 0700 if patient remains inpatient                               - Keppra x7d, ends 6/24      - F/u in 4 weeks     Tachycardia, paroxysmal                 - troponin negative at outside facility - repeat negative                 - electrolytes WNL                 - EKG NSR on repeat                 - ECHO ordered: Conclusions: There is hyperdynamic left venticular function. No Aortic valve stenosis.                - not not occur OVN on 6/17, continue telemetry/SD    Rhino/enterovirus  -States she just finished z-pack prior to admission w/o improvement in symptoms  -States continued congestion/unrelieved symptoms causing dysphonia (improved since adding sudafed and guaifenesin- Continue, will d/c Augmentin, will add sore throat lozenges)    Oxygen requirement  -On 4L this am, weaned to [facility]  -Unclear etiology, does have virus, no known chest trauma/injuries, obtain CXR  -CXR: Mild bibasilar atelectasis, No lobar consolidation, pleural effusion or pneumothorax.   -Wean as able   - Home O2 challenge showed desat with rest to 88  -  Labs: normocytic anemia 10.9 from 10.2         Hypomagnesia  -Replaced     Geri consulted; RECS  -Obtain A1C (6.9), vit d (normal), orthostatic VS (negative)  -DEXA outpatient,  PCP to taper elavil  - restart home DM meds at d/c       Diet: regular  Bowel regimen: senokot/miralax  Last bowel movement: PTA  Pain regimen: tylenol, gaba, lido patch PRN oxy   DVT ppx: lovenox  Activity: OOB with assist  Dispo: Pending PT/OT        Dispo: Continue weaning 02, steps with PT/OT, possible d/c today    Rebeca Alert DO  Emergency medicine PGY-1      Late entry for 06/16/23. I saw and examined the patient.  I reviewed the resident's note.  I agree with the findings and plan of care as documented in the resident's note.  Any exceptions/additions are edited/noted.    [name], DO

## 2023-06-22 DIAGNOSIS — J90 Pleural effusion, not elsewhere classified: Secondary | ICD-10-CM

## 2023-06-23 ENCOUNTER — Encounter (INDEPENDENT_AMBULATORY_CARE_PROVIDER_SITE_OTHER): Payer: Self-pay | Admitting: Nurse Practitioner

## 2023-06-23 NOTE — Discharge Summary (Signed)
[facility]  DISCHARGE SUMMARY      PATIENT NAME:  Haley Mccall  MRN:  [id number]  DOB:  [birth year]    ADMISSION DATE:  06/13/2023  DISCHARGE DATE:  06/16/2023    ATTENDING PHYSICIAN: Lacie Draft, DO  PRIMARY CARE PHYSICIAN: [facility]    ADMISSION DIAGNOSIS: Fall  DISCHARGE DIAGNOSIS:   Active Hospital Problems   (*Primary Problem)    Diagnosis    *Fall    SAH (subarachnoid hemorrhage) (CMS HCC)    Radius fracture    C3 cervical fracture (CMS HCC)    Tachycardia, paroxysmal (CMS HCC)       There are no active non-hospital problems to display for this patient.                                                     DISCHARGE MEDICATIONS:     Current Discharge Medication List        START taking these medications.        Details   acetaminophen 325 mg Tablet  Commonly known as: TYLENOL   650 mg, Oral, EVERY 6 HOURS  Refills: 0     benzocaine-menthol Lozenge   1 Lozenge, Oral, EVERY 2 HOURS PRN  Refills: 0     guaiFENesin 100 mg/5 mL Liquid   200 mg, Oral, EVERY 4 HOURS PRN  Refills: 0     levETIRAcetam 500 mg Tablet  Commonly known as: KEPPRA   500 mg, Oral, 2 TIMES DAILY  Qty: 9 Tablet  Refills: 0     methocarbamoL 500 mg Tablet  Commonly known as: ROBAXIN   1,000 mg, Oral, 4 TIMES DAILY  Qty: 112 Tablet  Refills: 0     naloxone 4 mg/actuation Spray, Non-Aerosol  Commonly known as: NARCAN   Administer 1 spray by alternating nostril every 2 minutes as needed for actual or suspected opioid overdose. Call 911 if given.  Qty: 2 Each  Refills: 0     Nasal Decongestant (pseudoeph) 30 mg Tablet  Generic drug: pseudoephedrine   30 mg, Oral, EVERY 6 HOURS PRN  Qty: 28 Tablet  Refills: 0     oxyCODONE 5 mg Tablet  Commonly known as: ROXICODONE   5 mg, Oral, EVERY 4 HOURS PRN  Qty: 42 Tablet  Refills: 0     Stimulant Laxative Plus 8.6-50 mg Tablet  Generic drug: sennosides-docusate sodium   1 Tablet, Oral, 2 TIMES DAILY  Qty: 14 Tablet  Refills: 0            CONTINUE these  medications - NO CHANGES were made during your visit.        Details   aMILoride-hydroCHLOROthiazide 5-50 mg Tablet  Commonly known as: MODURETIC   1 Tablet, Oral, DAILY  Refills: 0     amitriptyline 10 mg Tablet  Commonly known as: ELAVIL   10 mg, Oral, NIGHTLY  Refills: 0     cholecalciferol (vitamin D3) 50 mcg (2,000 unit) Tablet   2,000 Units, Oral, DAILY  Refills: 0     cyanocobalamin 1,000 mcg Tablet  Commonly known as: VITAMIN B 12   1,000 mcg, Oral, DAILY  Refills: 0     EMPAGLIFLOZIN-METFORMIN ORAL   12.5 mg, Oral, DAILY  Refills: 0     gabapentin 100 mg Capsule  Commonly known  as: NEURONTIN   100 mg, Oral, 2 TIMES DAILY  Refills: 0     magnesium chloride 64 mg Tablet, Delayed Release (E.C.)  Commonly known as: SLOW-MAG   64 mg, Oral, DAILY  Refills: 0     omeprazole 20 mg Capsule, Delayed Release(E.C.)  Commonly known as: PRILOSEC   20 mg, Oral, DAILY  Refills: 0     PARoxetine 30 mg Tablet  Commonly known as: PAXIL   30 mg, Oral, EVERY MORNING  Refills: 0     rosuvastatin 10 mg Tablet  Commonly known as: CRESTOR   10 mg, Oral, EVERY EVENING  Refills: 0     Trulicity 1.5 mg/0.5 mL Pen Injector  Generic drug: dulaglutide   1.5 mg, Subcutaneous, EVERY 7 DAYS  Refills: 0            STOP taking these medications.      amLODIPine 2.5 mg Tablet  Commonly known as: NORVASC     losartan 100 mg Tablet  Commonly known as: COZAAR            ASK your doctor about these medications.        Details   K-Phos-NeutraL 250 mg Tablet  Generic drug: k phos di & mono-sod phos mono  Ask about: Should I take this medication?   Take 1 tablet by mouth at night for 1 dose  Qty: 1 Tablet  Refills: 0              DISCHARGE INSTRUCTIONS:      [facility]    Trauma recovery starts with medical healing but impacts so many more aspects of your life.     We invite you to join other survivors in our [facility] group.    Meeting are held virtually on the third Thursday of every month from 6:00-7:00 pm.     For more information and the secure virtual link, email [email or web address]     You can also check the [facility] on the Trauma Survivors Network website, a national organization created to help people and families who have experienced trauma or find [place] on [employer].      Come meet other trauma survivors and discuss returning home, work and coping with life after major trauma.    We hope you'll join [place]!     DISCHARGE INSTRUCTION - RESUME HOME DIET     Diet: RESUME HOME DIET      DISCHARGE INSTRUCTION - ACTIVITY    C-collar at all times  Coffee cup weight bearing to left arm     Activity: GRADUALLY INCREASE ACTIVITY AS TOLERATED    Activity: NO DRIVING UNTIL PAIN FREE AND OFF PAIN MEDICATIONS    Activity: NO DRIVING UNTIL CLEARED BY PHYSICIAN      DISCHARGE INSTRUCTION - TRAUMA    INTRACRANIAL HEMORRHAGE  Includes symptoms:  HEADACHE.  A mild, dull headache that may or may not be relieved with medication is common.  Headaches will resolve over time.  DIZZINESS/LIGHTHEADEDNESS.  May occur when changing positions, such as from lying down to sitting up or sitting to standing.  Change position slowly and use stable objects to assist when changing position or standing.  MILD NAUSEA.  Avoid heavy meals over the next 72 hours, slowly increase your diet.  SHORT-TERM MEMORY PROBLEMS  EMOTIONAL CHANGES  IRRITABILITY  DIFFICULTY WITH CONCENTRATION  You may return to your normal activities, including driving once you are not experiencing any dizziness.  Do  not drink alcoholic beverages for at least one week.  Avoid recreational drugs, as they may delay recovery.  These symptoms should slowly resolve over days, but may occur up to weeks.  If you do not feel improvement over a two weeks time, make an appointment with the trauma clinic for further evaluation.  Discuss with your trauma physician when you will be able to resume taking any antiplatelet or anticoagulation therapies (i.e.  Plavix, Aspirin, Coumadin).    Return to the ED for any worsening symptoms listed below:  Worsening headache  Forceful vomiting  One pupil arger than the other  Increased sleepiness  Inability to arouse/awaken easily  Slurred speech  Seizure-like activity  Stumbling  Double vision confusion    ______________________________      CAST CARE  DO NOT place anything in your cast to scratch your skin.  If itching is a problem, use a blow dryer on cool setting only.  NEVER trim or cut your cast.  NEVER stuff cotton or toilet paper in your cast.  NEVER place lotion or powder inside the cast.  AVOID getting the cast wet.  If this does happen, towel off, use blow dryer on cool setting.  The cast can take more than two hours to dry.  Contact your orthopaedic doctor for:  Numbness/tingling or burning of the casted area.  Continued coldness  Discoloration (blue/white)  Unusual smell or drainage  If the cast becomes too tight or excessive movement noted under the cast  Unable to move fingers or toes    ______________________________      CERVICAL NECK FRACTURE  Your doctor has advised you wear a cervical collar to support your upper spine during the healing process following an injury.  The cervical collar is a two-piece collar designed to keep the upper portion of your spine from moving.  It is an important part of your recovery.      Expect:  The collar will probably applied to your neck while you are lying flat on your back.  While the collar is being placed, keep your head and neck straight at all times.   If you have long hair, it should be always be outside of the collar.  The from piece of the collar will be placed first, with your chin resting on the center of the collar.  The back piece of the collar will be placed next, with the back of your head resting on the center of the collar.  The Velcro strips from the back piece will attach to the front piece - tightening one strap at a time until there is a snug fit.  The  sides of the back piece will overlap the sides of the front piece and no plastic will touch your skin.  Minor adjustments in your collar may be necessary to improve comfort and reduce pressure on your chin or the back of your head.  The collar should be snug enough to prevent you from turning your head but you should still be able to speak and swallow.  Wear this cervical collar at all times as your doctor has ordered.        Note:  This collar includes removable pads to prevent pressure and skin breakdown.    Cleaning:  Before removing the collar, note where the ends of the Velcro straps are and mark with a pen on the plastic part of the collar.  The straps should be in the same position when you put the collar  back on.  Remove pads from the collar.  While you remove the collar, you must lie in the flat position.  Check your skin for breakdown or irritation when the collar is removed by using a mirror or have another person examine for you.  Hand wash each pad with mild soap and water.  Thoroughly rinse, lay the pads flat, and allow to air dry completely before reusing.  Use replacement pads to continue wearing the collar as prescribed.  The plastic part of the collar can be wiped with mild soap and water and dried thoroughly.  Do not use bleach or harsh chemicals for cleaning.  Pads should be changed and cleaned every 24 to 48 hours.  Wash the skin of the neck with each pad change.  Pads should be replaced when they show wear, fray or separate.  You should wear your collar when bathing and/or showering until cleared by the trauma physician.    Call your doctor if you experience:  Your collar presses too hard against your skin and causes redness, blistering or skin breakdown.  No relief from discomfort caused by the collar.  Any part of your collar breaks or no longer supports your neck as prescribed.  You have numbness and/or tingling in arms or increase in pain.    ________________________________________      DISCHARGE INSTRUCTION - MISC    Please follow up with your PCP after discharge to review your hospitalization records and any medication changes. We would like them to talk to you about getting a DEXA scan and stopping your amitriptyline.     FOLLOW-UP: ORTHOPEDICS - HAND CLINIC - [facility] - [place], [place]     Follow-up in: 1 WEEK    Reason for visit: HOSPITAL DISCHARGE    Post Discharge Destination: Home    Diagnosis Distal radius fracture, left [[id number]]      FOLLOW-UP: ORTHOPEDICS - SP[facility] C[facility] - [facility] - [place], [place]     Follow-up in: 1 WEEK    Reason for visit: HOSPITAL DISCHARGE    Post Discharge Destination: Other    Other: TBD    Diagnosis Closed C3 fracture (CMS HCC) [[id number]]    Provider: Sharia Reeve Long      FOLLOW-UP: NEUROSURGERY - [facility] - , [place]    Please schedule on a Tuesday for NeuroTrauma follow up     Follow-up in: 1 MONTH    Reason for visit: HOSPITAL DISCHARGE    Post Discharge Destination: Home    Diagnosis SAH (subarachnoid hemorrhage) (CMS [facility]) [[id number]]    Follow-up reason: small tSAH in R sylvian fissure    Provider: Brandmeir or Spatzer        REASON FOR HOSPITALIZATION and INITIAL TRAUMA EVALUATION:  The patient is a 72 y.o. female who presented to [facility] ED as a P2 trauma status post Fall on 06/13/2023. Report stated she tripped falling forward, denies any LOC, striking her head and left side, reported immediate headache and nausea, vomiting, patient able to ambulate post fall and family notified EMS. Patient present to outside facility underwent CT Brain, CT C-spine, CT face, and x-rays, injuries include right distal radius fracture, trace SAH, and C3 fracture. Patient arrived [facility] denies any headache, reports neck pain managed with collar, denies any numbness/tingling, loss of sensation, denies any chest pain, rib pain, nausea, vomiting, abdominal pain, back pain, weakness. Marland Kitchen        HOSPITAL COURSE:  The patient was admitted  by the Trauma Service  to the ICU.    Daily Hospital Events: 6/19: Repeat CT brain stable, non-op ortho plans, transfer to SD, reg diet, pain control, geri consult, PT/OT  6/20: PT/OT for dispo plan          CONSULTS:  The following services were consulted during the hospitalization  Orthopedics, Neurosurgery, and Geriatrics  Their recommendations at discharge are: will see back in Spine clinic w Josh Long in 1wk. follow up with Neurosurgery in 4 weeks       The patient was determined safe for discharge on 6/20.  Prior to discharge her pain was well controlled on oral pain medications and physical therapy felt she was safe for discharge home with assist.  The patient will follow up with Trauma in 2 weeks for reevaluation.  The patient will also follow up with Orthopedics and Neurosurgery.  All questions were answered prior to discharge and the patient agreed to discharge at this time. The patient was instructed to follow up sooner for new or concerning symptoms.     A thorough history of non-opioid medications, non-pharmacologic treatment and substance abuse was taken on this patient prior to discharge.  The patient has the following injuries that require outpatient narcotic treatment   Active Hospital Problems    Diagnosis    Primary Problem: [facility] (subarachnoid hemorrhage) (CMS HCC)    Radius fracture    C3 cervical fracture (CMS HCC)    Tachycardia, paroxysmal (CMS HCC)   .  The patient is being given a prescription for one week and will be reassessed by phone or in clinic as needed.  Alternative pain treatments were discussed with this patient such as acetaminophen, nonsteroidal antiinflammatory medications, muscle relaxers, topical pain medicine, medications for neuropathic pain, physical therapy, exercise, elevation, yoga, massage, relaxation and biofeedback.       CONDITION ON DISCHARGE:  A. Ambulation: full  B. Self-care Ability: full  C. Cognitive Status: GCS 15  D. LACE + Score: LACE+ Score (Read  Only): 45  E: Wound Care: n/a    DISCHARGE DISPOSITION:  Home discharge     Copies sent to Care Team         Relationship Specialty Notifications Start End    [facility], [facility] PCP - General EXTERNAL  05/29/23     Phone: [phone] Fax: [phone]         [address] [place] [address]            [name], DO 06/23/2023, 11:35
